# Patient Record
Sex: Male | Born: 1935 | Race: White | Hispanic: No | Smoking: Former smoker
Health system: Southern US, Community
[De-identification: ages and names within clinical notes are randomized; demographics above are authoritative.]

## PROBLEM LIST (undated history)

## (undated) DIAGNOSIS — J449 Chronic obstructive pulmonary disease, unspecified: Secondary | ICD-10-CM

## (undated) DIAGNOSIS — N400 Enlarged prostate without lower urinary tract symptoms: Secondary | ICD-10-CM

## (undated) DIAGNOSIS — K219 Gastro-esophageal reflux disease without esophagitis: Secondary | ICD-10-CM

## (undated) DIAGNOSIS — E7800 Pure hypercholesterolemia, unspecified: Secondary | ICD-10-CM

## (undated) DIAGNOSIS — I10 Essential (primary) hypertension: Secondary | ICD-10-CM

## (undated) DIAGNOSIS — F5104 Psychophysiologic insomnia: Secondary | ICD-10-CM

## (undated) DIAGNOSIS — D649 Anemia, unspecified: Secondary | ICD-10-CM

## (undated) HISTORY — DX: Chronic obstructive pulmonary disease, unspecified: J44.9

## (undated) HISTORY — DX: Pure hypercholesterolemia, unspecified: E78.00

## (undated) HISTORY — PX: OTHER SURGICAL HISTORY: SHX169

## (undated) HISTORY — DX: Essential (primary) hypertension: I10

## (undated) HISTORY — PX: NOSE SURGERY: SHX723

## (undated) HISTORY — DX: Psychophysiologic insomnia: F51.04

## (undated) HISTORY — DX: Gastro-esophageal reflux disease without esophagitis: K21.9

---

## 2010-03-22 ENCOUNTER — Telehealth: Payer: Self-pay | Admitting: Gastroenterology

## 2010-03-23 ENCOUNTER — Encounter: Payer: Self-pay | Admitting: Gastroenterology

## 2010-03-23 ENCOUNTER — Ambulatory Visit: Payer: Self-pay | Admitting: Gastroenterology

## 2010-03-23 DIAGNOSIS — R197 Diarrhea, unspecified: Secondary | ICD-10-CM | POA: Insufficient documentation

## 2010-03-23 DIAGNOSIS — R109 Unspecified abdominal pain: Secondary | ICD-10-CM | POA: Insufficient documentation

## 2010-03-31 ENCOUNTER — Ambulatory Visit (HOSPITAL_COMMUNITY)
Admission: RE | Admit: 2010-03-31 | Discharge: 2010-03-31 | Payer: Self-pay | Source: Home / Self Care | Attending: Gastroenterology | Admitting: Gastroenterology

## 2010-03-31 HISTORY — PX: COLONOSCOPY: SHX174

## 2010-05-03 ENCOUNTER — Ambulatory Visit
Admission: RE | Admit: 2010-05-03 | Discharge: 2010-05-03 | Payer: Self-pay | Source: Home / Self Care | Attending: Gastroenterology | Admitting: Gastroenterology

## 2010-05-03 DIAGNOSIS — M359 Systemic involvement of connective tissue, unspecified: Secondary | ICD-10-CM | POA: Insufficient documentation

## 2010-05-04 ENCOUNTER — Encounter: Payer: Self-pay | Admitting: Gastroenterology

## 2010-05-05 LAB — CONVERTED CEMR LAB
ALT: 10 units/L (ref 0–53)
Basophils Absolute: 0 10*3/uL (ref 0.0–0.1)
CO2: 31 meq/L (ref 19–32)
Calcium: 9.7 mg/dL (ref 8.4–10.5)
Chloride: 97 meq/L (ref 96–112)
Eosinophils Relative: 1 % (ref 0–5)
Glucose, Bld: 83 mg/dL (ref 70–99)
HCT: 43.9 % (ref 39.0–52.0)
Hemoglobin: 14.9 g/dL (ref 13.0–17.0)
Lymphocytes Relative: 15 % (ref 12–46)
Lymphs Abs: 1.2 10*3/uL (ref 0.7–4.0)
Monocytes Absolute: 0.6 10*3/uL (ref 0.1–1.0)
Neutro Abs: 6.3 10*3/uL (ref 1.7–7.7)
Platelets: 190 10*3/uL (ref 150–400)
Sodium: 137 meq/L (ref 135–145)
Total Bilirubin: 1.2 mg/dL (ref 0.3–1.2)
Total Protein: 6.7 g/dL (ref 6.0–8.3)
WBC: 8.1 10*3/uL (ref 4.0–10.5)

## 2010-05-18 NOTE — Letter (Signed)
Summary: RECORDS FROM [facility]IENT [facility]  RECORDS FROM [facility]   Imported By: Rexene Alberts 03/23/2010 14:55:22  _____________________________________________________________________  External Attachment:    Type:   Image     Comment:   External Document

## 2010-05-20 NOTE — Assessment & Plan Note (Signed)
Summary: HAVING BM EVERY HR FOR THE PAST 2WKS/LAW   Visit Type:  Initial Consult Referring Jerica Creegan:  [facility] Lior Hoen:  Heron Nay, M.D.  CC:  diarrhea.  History of Present Illness: John Chambers is a pleasant 77 year old Caucasian male who presents today at the request of Dr. Heron Nay secondary to acute diarrhea. States for past 15 days has experienced diarrhea, +abdominal cramping lower abdomen, varies in amount daily. Has gone 3X this morning, watery, loose. Not r/t eating or drinking. No brbpr. Prior to this had BM daily. Reports grandkids had "stomach bug". No use of abx recently, not hospitalized, drinks well water. Urine clear. Does report lack of appetite, daughter states wife passed away one year ago. feels might be related. pt reports weight normally around 158. today 146.   Current Medications (verified): 1)  Lorazepam 0.5 Mg Tabs (Lorazepam) .... Take 1 Tablet By Mouth Two Times A Day 2)  Lansoprazole 30 Mg Tbdp (Lansoprazole) .... Take 1 Tablet By Mouth Once A Day 3)  Lipitor 10 Mg Tabs (Atorvastatin Calcium) .... Take 1 Tablet By Mouth Once A Day 4)  Zolpidem Tartrate 10 Mg Tabs (Zolpidem Tartrate) .... Take 1 Tablet By Mouth At Bedtime 5)  Mirtazapine 15 Mg Tabs (Mirtazapine) .... Take 1 Tab By Mouth At Bedtime 6)  Advair Diskus 250-50 Mcg/dose Aepb (Fluticasone-Salmeterol) .... As Directed  Allergies (verified): 1)  ! Asa 2)  ! Penicillin  Past History:  Past Medical History: Hypertension Hypercholesterolemia GERD COPD Chronic Insomnia  Past Surgical History: Right knee X 3 Nose Last TCS 10 years ago?  Family History: Mother: non-contributory Father: MI, deceased Siblings: brother died at 66 MVA No FH of Colon Cancer:  Social History: Retired Patient currently smokes: 7 cigarettes/day Daily Caffeine Use: coffee daily Alcohol Use - yes: moderate Smoking Status:  current  Review of Systems General:  Denies fever, chills, and  anorexia. Eyes:  Denies blurring, irritation, and discharge. ENT:  Denies sore throat, hoarseness, and difficulty swallowing. CV:  Denies chest pains and syncope. Resp:  Denies dyspnea at rest and wheezing. GI:  Complains of abdominal pain and diarrhea; denies difficulty swallowing, pain on swallowing, nausea, bloody BM's, and black BMs. GU:  Denies urinary burning and urinary frequency. MS:  Denies joint pain / LOM, joint stiffness, and joint deformity. Derm:  Denies rash, itching, and dry skin. Neuro:  Denies weakness and syncope. Psych:  Denies depression and anxiety. Endo:  Denies cold intolerance and heat intolerance. Heme:  Denies bruising and bleeding.  Vital Signs:  Patient profile:   76 year old male Height:      70 inches Weight:      146.50 pounds BMI:     21.10 Temp:     99.1 degrees F oral Pulse rate:   84 / minute BP sitting:   120 / 80  (left arm) Cuff size:   regular  Vitals Entered By: Cloria Spring LPN (March 23, 2010 9:26 AM)  Physical Exam  General:  Well developed, well nourished, no acute distress. Mouth:  mucosa moist, pink.  Lungs:  Clear throughout to auscultation. Heart:  Regular rate and rhythm; no murmurs, rubs,  or bruits. Abdomen:  normal bowel sounds, without guarding, without rebound, no distesion, no tenderness, no masses, and no hepatomegally or splenomegaly.   Msk:  Symmetrical with no gross deformities. Normal posture. Pulses:  Normal pulses noted. Extremities:  No clubbing, cyanosis, edema or deformities noted. Neurologic:  Alert and  oriented x4;  grossly normal neurologically.  Skin:  Intact without significant lesions or rashes. Psych:  Alert and cooperative. Normal mood and affect.  Impression & Recommendations:  Problem # 1:  DIARRHEA (ICD-64.75) 77 year old pleasant Caucasian male with acute onset of diarrhea approximately 2 weeks ago, preceeded by abdominal cramping, not r/t eating or drinking. Denies any melena or  hematochezia. +loss of appetite, question if r/t wife's passing a year ago, weight loss of approximately 10-12 lbs reported. Last TCS approximately 10 years ago, not here. Grandkids sick with GI issues recently. Will check stool studies, immodium as needed, set up for TCS with Dr. Darrick Penna.   Stool studies (see orders) Immodium as needed TCS with Dr. Darrick Penna on 12/14 with half-lytely prep. The risks, benefits, and alternatives were discussed in detail with pt and daughter. Verbal consent obtained.  Orders: T-Culture, Stool ([id number]) T-Stool for O&P [phone]) T-Fecal WBC [id number]) T-Culture, C-Diff Toxin A/B ([id number]) T-Culture, C-Diff Toxin A/B ([id number]) T-Culture, C-Diff Toxin A/B ([id number]) Consultation Level III (16606)  Problem # 2:  ABDOMINAL PAIN (ICD-789.00) Likely r/t acute onset of diarrhea., See # 1.    Orders: T-Culture, Stool ([id number]) T-Stool for O&P ([id number]) T-Fecal WBC [phone]) T-Culture, C-Diff Toxin A/B ([id number]) T-Culture, C-Diff Toxin A/B ([id number]) T-Culture, C-Diff Toxin A/B ([id number]) Consultation Level III (85462) Prescriptions: IMODIUM A-D 2 MG TABS (LOPERAMIDE HCL) 1 by mouth every 6 hours as needed for diarrhea. do not exceed 8 tablets per day.  #120 x 0   Entered and Authorized by:   Gerrit Halls NP   Signed by:   Gerrit Halls NP on 03/23/2010   Method used:   Printed then faxed to ...       [facility] [phone]* (retail)       [address]       [place], [place]  [address]       Ph: [phone]       Fax:    RxID:   [id number]   Appended Document: HAVING BM EVERY HR FOR THE PAST 2WKS/LAW Please set up for TCS with Dr. Darrick Penna on 12/14 with half-lytely prep. She is aware.  Appended Document: HAVING BM EVERY HR FOR THE PAST 2WKS/LAW I called the number listed for the pt,lmom.

## 2010-05-20 NOTE — Progress Notes (Signed)
Summary: DIARRHEA  03/22/10: Please schedule pt to be seen ASAP for diarrhea. Pt having a BM every hour for the past 2 weeks. Call his granddaughter 03/[birth year]89 Romeo Apple at [phone] with an appt. Need him seen Mon, Tue, or Wed. His PCP is Dr. Treasa School, [phone], [place], [place]. PLEASE OBTAIN ALL RECORDS FROM SEP TO THE PRESENT. West Bali MD  March 22, 2010 11:30 AM   Appended Document: DIARRHEA PP FU IN 05/03/10 @ 1000 w/AS

## 2010-05-20 NOTE — Assessment & Plan Note (Signed)
Summary: pp fu in one months with AS/diarrhea/ss   Visit Type:  Follow-up Visit Primary Care Provider:  Heron Nay, M.D.  CC:  F/U diarrhea.  History of Present Illness: Pt is here in follow-up. Was seen for chronic diarrhea, underwent colonoscopy showing small internal hemorrhoids Dec 2011, no polyps. path +miscroscopic colitis. Started on pepto bismol tablets, 4 2x per day. Doing well no with 1-2BM daily. No melena, no brbpr. No abdominal pain, n/v. Denies dysphagia/odynophagia. Does report decreased appetite since wife passed away approximately one year ago. Does well when eating out with others, but does not have much appetite at home by himself. Down to 32, was 146 early December.    Current Medications (verified): 1)  Lorazepam 0.5 Mg Tabs (Lorazepam) .... Take 1 Tablet By Mouth Two Times A Day 2)  Lipitor 10 Mg Tabs (Atorvastatin Calcium) .... Take 1 Tablet By Mouth Once A Day 3)  Zolpidem Tartrate 10 Mg Tabs (Zolpidem Tartrate) .... Take 1 Tablet By Mouth At Bedtime 4)  Advair Diskus 250-50 Mcg/dose Aepb (Fluticasone-Salmeterol) .... As Directed 5)  Imodium A-D 2 Mg Tabs (Loperamide Hcl) .Marland Kitchen.. 1 By Mouth Every 6 Hours As Needed For Diarrhea. Do Not Exceed 8 Tablets Per Day. 6)  Pepto Bismol Tablets .... Chew 4 Tablets Twice Daily 7)  Zolpidem Tartrate 10 Mg Tabs (Zolpidem Tartrate) .... One Tablet At Bedtime 8)  Nexium 40 Mg Cpdr (Esomeprazole Magnesium) .... Take 1 Tablet By Mouth Once A Day 9)  Lisinopril-Hydrochlorothiazide 10-12.5 Mg Tabs (Lisinopril-Hydrochlorothiazide) .... Take 1 Tablet By Mouth Once A Day  Allergies (verified): 1)  ! Asa 2)  ! Penicillin  Past History:  Past Medical History: Hypertension Hypercholesterolemia GERD COPD Chronic Insomnia 03/31/2010 with [facility], TCS: FINDINGS: path showed microscopic colitis 1. Normal terminal ileum approximately 10 cm visualized. 2. Normal colon without evidence of polyps, masses, inflammatory     changes,  diverticula, or AVMs.  Random biopsy is obtained via cold     forceps for microscopic colitis. 3. Small internal hemorrhoids.  Otherwise, normal retroflexed view of     the rectum.  Review of Systems General:  Denies fever, chills, and anorexia. Eyes:  Denies blurring, irritation, and discharge. ENT:  Denies sore throat, hoarseness, and difficulty swallowing. CV:  Denies chest pains. Resp:  Denies dyspnea at rest and wheezing. GI:  Denies difficulty swallowing, pain on swallowing, nausea, abdominal pain, diarrhea, constipation, change in bowel habits, bloody BM's, and black BMs. GU:  Denies urinary burning and urinary frequency. MS:  Denies joint pain / LOM, joint swelling, and joint stiffness. Derm:  Denies rash, itching, and dry skin. Neuro:  Denies weakness and syncope. Psych:  Denies depression and anxiety. Endo:  Denies cold intolerance and heat intolerance. Heme:  Denies bruising and bleeding.  Vital Signs:  Patient profile:   77 year old male Height:      70 inches Weight:      142 pounds BMI:     20.45 Temp:     97.8 degrees F oral Pulse rate:   80 / minute BP sitting:   126 / 80  (left arm) Cuff size:   regular  Vitals Entered By: Cloria Spring LPN (May 03, 2010 10:01 AM)  Physical Exam  General:  Well developed, well nourished, no acute distress. Head:  Normocephalic and atraumatic. Eyes:  sclera without icterus, conjuctiva clear Mouth:  No deformity or lesions, dentition normal. Lungs:  Clear throughout to auscultation. Heart:  Regular rate and rhythm; no murmurs, rubs,  or bruits. Abdomen:  +BS, soft, NT, ND, no rebound or guarding, no HSM.  Msk:  Symmetrical with no gross deformities. Normal posture. Neurologic:  Alert and  oriented x4;  grossly normal neurologically. Skin:  Intact without significant lesions or rashes. Psych:  Alert and cooperative. Normal mood and affect.  Impression & Recommendations:  Problem # 1:  COLLAGENOUS COLITIS  (ICD-91.83)  77 year old male with microscopic colitis found on path from colonoscopy Dec 2011. On pepto bismol 4 tabs two times a day, diarrhea now resolved. No abdominal pain. Normal BM 1-2X per day. No melena or brbpr. Doing well. Down another 4 lbs from last visit. Does report lack of appetite when eating by himself, yet does well when eating with others. Wife passed away approximately 1 year ago, states hard to eat alone.   Continue course of pepto until complete as directed CBC/CMP today Return in 3 mos for wt check.   Orders: Est. Patient Level II (04540)  Other Orders: T-CBC w/Diff ([id number]) T-Comprehensive Metabolic Panel ([id number])  Appended Document: pp fu in one months with AS/diarrhea/ss 3 mon f/u opv is in the computer

## 2010-06-21 ENCOUNTER — Encounter: Payer: Self-pay | Admitting: Gastroenterology

## 2010-06-28 ENCOUNTER — Ambulatory Visit: Payer: Self-pay | Admitting: Gastroenterology

## 2010-07-22 ENCOUNTER — Encounter: Payer: Self-pay | Admitting: Gastroenterology

## 2010-12-05 ENCOUNTER — Observation Stay (HOSPITAL_COMMUNITY)
Admission: EM | Admit: 2010-12-05 | Discharge: 2010-12-06 | Disposition: A | Discharge status: Home / Self Care | Payer: Medicare Other | Attending: Internal Medicine | Admitting: Internal Medicine

## 2010-12-05 ENCOUNTER — Encounter (HOSPITAL_COMMUNITY): Payer: Self-pay | Admitting: Emergency Medicine

## 2010-12-05 ENCOUNTER — Emergency Department (HOSPITAL_COMMUNITY): Payer: Medicare Other

## 2010-12-05 ENCOUNTER — Other Ambulatory Visit: Payer: Self-pay

## 2010-12-05 DIAGNOSIS — I10 Essential (primary) hypertension: Secondary | ICD-10-CM | POA: Diagnosis present

## 2010-12-05 DIAGNOSIS — K219 Gastro-esophageal reflux disease without esophagitis: Secondary | ICD-10-CM | POA: Insufficient documentation

## 2010-12-05 DIAGNOSIS — R0789 Other chest pain: Principal | ICD-10-CM | POA: Insufficient documentation

## 2010-12-05 DIAGNOSIS — Z91199 Patient's noncompliance with other medical treatment and regimen due to unspecified reason: Secondary | ICD-10-CM | POA: Insufficient documentation

## 2010-12-05 DIAGNOSIS — E785 Hyperlipidemia, unspecified: Secondary | ICD-10-CM | POA: Insufficient documentation

## 2010-12-05 DIAGNOSIS — E871 Hypo-osmolality and hyponatremia: Secondary | ICD-10-CM | POA: Insufficient documentation

## 2010-12-05 DIAGNOSIS — J449 Chronic obstructive pulmonary disease, unspecified: Secondary | ICD-10-CM | POA: Insufficient documentation

## 2010-12-05 DIAGNOSIS — R079 Chest pain, unspecified: Secondary | ICD-10-CM | POA: Diagnosis present

## 2010-12-05 DIAGNOSIS — Z9119 Patient's noncompliance with other medical treatment and regimen: Secondary | ICD-10-CM | POA: Insufficient documentation

## 2010-12-05 DIAGNOSIS — J4489 Other specified chronic obstructive pulmonary disease: Secondary | ICD-10-CM | POA: Insufficient documentation

## 2010-12-05 DIAGNOSIS — Z79899 Other long term (current) drug therapy: Secondary | ICD-10-CM | POA: Insufficient documentation

## 2010-12-05 LAB — COMPREHENSIVE METABOLIC PANEL
ALT: 14 U/L (ref 0–53)
AST: 23 U/L (ref 0–37)
Alkaline Phosphatase: 64 U/L (ref 39–117)
CO2: 30 mEq/L (ref 19–32)
Calcium: 9.6 mg/dL (ref 8.4–10.5)
Chloride: 95 mEq/L — ABNORMAL LOW (ref 96–112)
GFR calc Af Amer: >60 mL/min (ref >60)
GFR calc non Af Amer: >60 mL/min (ref >60)
Glucose, Bld: 102 mg/dL — ABNORMAL HIGH (ref 70–99)
Potassium: 3.7 mEq/L (ref 3.5–5.1)
Sodium: 133 mEq/L — ABNORMAL LOW (ref 135–145)
Total Bilirubin: 0.3 mg/dL (ref 0.3–1.2)

## 2010-12-05 LAB — CBC
MCV: 93.8 fL (ref 78.0–100.0)
Platelets: 165 10*3/uL (ref 150–400)
RBC: 3.89 MIL/uL — ABNORMAL LOW (ref 4.22–5.81)
RDW: 12.7 % (ref 11.5–15.5)
WBC: 6.5 10*3/uL (ref 4.0–10.5)

## 2010-12-05 LAB — DIFFERENTIAL
Basophils Absolute: 0 10*3/uL (ref 0.0–0.1)
Lymphocytes Relative: 21 % (ref 12–46)
Lymphs Abs: 1.4 10*3/uL (ref 0.7–4.0)
Neutro Abs: 4.3 10*3/uL (ref 1.7–7.7)
Neutrophils Relative %: 66 % (ref 43–77)

## 2010-12-05 LAB — POCT I-STAT TROPONIN I

## 2010-12-05 LAB — CARDIAC PANEL(CRET KIN+CKTOT+MB+TROPI): Troponin I: <0.3 ng/mL (ref <0.30)

## 2010-12-05 MED ORDER — LISINOPRIL 10 MG PO TABS
10.0000 mg | ORAL_TABLET | Freq: Every day | ORAL | Status: DC
  Administered 2010-12-06: 10 mg via ORAL
  Filled 2010-12-05: qty 1

## 2010-12-05 MED ORDER — SIMVASTATIN 20 MG PO TABS
20.0000 mg | ORAL_TABLET | Freq: Every day | ORAL | Status: DC
  Administered 2010-12-06: 20 mg via ORAL
  Filled 2010-12-05: qty 1

## 2010-12-05 MED ORDER — LISINOPRIL 10 MG PO TABS: 10.0000 mg | ORAL_TABLET | Freq: Every day | ORAL | Status: DC

## 2010-12-05 MED ORDER — LORAZEPAM 0.5 MG PO TABS
0.5000 mg | ORAL_TABLET | Freq: Three times a day (TID) | ORAL | Status: DC
  Administered 2010-12-05 – 2010-12-06 (×2): 0.5 mg via ORAL
  Filled 2010-12-05 (×3): qty 1

## 2010-12-05 MED ORDER — LISINOPRIL-HYDROCHLOROTHIAZIDE 10-12.5 MG PO TABS: 1.0000 | ORAL_TABLET | Freq: Every day | ORAL | Status: DC

## 2010-12-05 MED ORDER — SODIUM CHLORIDE 0.9 % IV SOLN
Freq: Once | INTRAVENOUS | Status: AC
  Administered 2010-12-05: 19:00:00 via INTRAVENOUS

## 2010-12-05 MED ORDER — LISINOPRIL 2.5 MG PO TABS: 12.5000 mg | ORAL_TABLET | Freq: Once | ORAL | Status: DC

## 2010-12-05 MED ORDER — HYDROCHLOROTHIAZIDE 25 MG PO TABS
12.5000 mg | ORAL_TABLET | Freq: Every day | ORAL | Status: DC
  Administered 2010-12-06: 12.5 mg via ORAL
  Filled 2010-12-05: qty 1

## 2010-12-05 MED ORDER — SODIUM CHLORIDE 0.9 % IJ SOLN
3.0000 mL | Freq: Two times a day (BID) | INTRAMUSCULAR | Status: DC
  Administered 2010-12-06: 3 mL via INTRAVENOUS
  Filled 2010-12-05: qty 3

## 2010-12-05 MED ORDER — SODIUM CHLORIDE 0.9 % IV SOLN: 250.0000 mL | INTRAVENOUS | Status: DC

## 2010-12-05 MED ORDER — SODIUM CHLORIDE 0.9 % IJ SOLN: 3.0000 mL | INTRAMUSCULAR | Status: DC | PRN

## 2010-12-05 MED ORDER — PANTOPRAZOLE SODIUM 40 MG PO TBEC
40.0000 mg | DELAYED_RELEASE_TABLET | Freq: Every day | ORAL | Status: DC
  Administered 2010-12-06: 40 mg via ORAL
  Filled 2010-12-05: qty 1

## 2010-12-05 MED ORDER — FLUTICASONE-SALMETEROL 250-50 MCG/DOSE IN AEPB
1.0000 | INHALATION_SPRAY | Freq: Two times a day (BID) | RESPIRATORY_TRACT | Status: DC
  Administered 2010-12-06: 1 via RESPIRATORY_TRACT
  Filled 2010-12-05: qty 14

## 2010-12-05 MED ORDER — MORPHINE SULFATE 2 MG/ML IJ SOLN: 1.0000 mg | INTRAMUSCULAR | Status: DC | PRN

## 2010-12-05 MED ORDER — HYDRALAZINE HCL 20 MG/ML IJ SOLN
10.0000 mg | Freq: Four times a day (QID) | INTRAMUSCULAR | Status: DC | PRN
  Filled 2010-12-05: qty 1

## 2010-12-05 NOTE — Progress Notes (Signed)
  [id number] [id number]

## 2010-12-05 NOTE — ED Notes (Signed)
Patient states that he has no pain.  Pt says he still feels somewhat SOB.  Respirations 20 per minute.

## 2010-12-05 NOTE — ED Notes (Signed)
Patient states that he began to have chest pain radiating to the left arm starting 3 hours ago after walking back to his house from feeding his dog.  He also began to have SOB.  Pt states that he took mylanta prior to arrival.  Past medical Hx of HTN, high cholesterol and current smoker.  Has a family hx of MI.

## 2010-12-05 NOTE — ED Provider Notes (Signed)
History  Scribed for Benny Lennert, MD, the patient was seen in room 10. This chart was scribed by Jannette Fogo. This patient's care was started at 1737.   CSN: [id number] Arrival date & time: 12/05/2010  5:21 PM  Chief Complaint  Patient presents with  . Chest Pain   HPI John Chambers is a 77 y.o. male with a history of COPD, HTN, and GERD who presents to the Emergency Department complaining of chest pain starting today which is currently improved. Patient reports that the chest pain began after coming back home from walking his dog. Chest pain radiated to the left arm and he had associated mild diaphoresis and SOB but denies nausea. Patient reports that chest pain was so severe that he could not pick up the phone. Chest pain lasted for "some time" and then improved.  Granddaughter states she found the patient diaphoretic, flushed, and breathing hard. The patient denies having any heart problems, cardiac catheterization, or stress test in the past. He reports an episode of chest pain two days ago that he attributes to possible heartburn/reflux which improved after Tums/Rolaids per family.  The patient reports that he quick tobacco use. There are no other associated symptoms and no other alleviating or aggravating factors.   PCP: Dr. Jeanie Sewer No present cardiologist   HPI ELEMENTS:  Location: chest Onset: ~3 hours ago Duration: improved Severity: 4/10 Context:  as above  Associated symptoms: mild diaphoresis, and SOB but denies nausea   Past Medical History  Diagnosis Date  . Hypertension   . Hypercholesterolemia   . GERD (gastroesophageal reflux disease)   . COPD (chronic obstructive pulmonary disease)   . Chronic insomnia     Past Surgical History  Procedure Date  . Colonoscopy 03/31/10    Dr. Darrick Penna  . Right knee     x3  . Nose surgery    MEDICATIONS:  Previous Medications   ATORVASTATIN (LIPITOR) 10 MG TABLET    Take 10 mg by mouth daily.     ESOMEPRAZOLE (NEXIUM) 40  MG CAPSULE    Take 40 mg by mouth daily before breakfast.     FLUTICASONE-SALMETEROL (ADVAIR DISKUS) 250-50 MCG/DOSE AEPB    Inhale 1 puff into the lungs every 12 (twelve) hours. AS DIRECTED    LISINOPRIL-HYDROCHLOROTHIAZIDE (PRINZIDE,ZESTORETIC) 10-12.5 MG PER TABLET    Take 1 tablet by mouth daily.     LOPERAMIDE (IMODIUM A-D) 2 MG TABLET    Take 2 mg by mouth 4 (four) times daily as needed.     LORAZEPAM (ATIVAN) 0.5 MG TABLET    Take 0.5 mg by mouth every 8 (eight) hours.     ZOLPIDEM (AMBIEN) 10 MG TABLET    Take 10 mg by mouth at bedtime as needed.       ALLERGIES:  Allergies as of 12/05/2010 - Review Complete 12/05/2010  Allergen Reaction Noted  . Aspirin    . Penicillins      FAMILY HISTORY:  + AMI  SOCIAL HISTORY: Accompanied to the ED by grandaughter History  Substance Use Topics  . Smoking status: Current Everyday Smoker -- 55 years    Types: Cigarettes  . Smokeless tobacco: Not on file  . Alcohol Use:     Review of Systems  Constitutional: Positive for diaphoresis. Negative for fatigue.  HENT: Negative for congestion, sinus pressure and ear discharge.   Eyes: Negative for discharge.  Respiratory: Positive for shortness of breath. Negative for cough.   Cardiovascular: Positive for chest pain.  Gastrointestinal:  Negative for nausea, vomiting, abdominal pain and diarrhea.  Genitourinary: Negative for frequency and hematuria.  Musculoskeletal: Negative for back pain.  Skin: Negative for rash.  Neurological: Negative for seizures and headaches.  Hematological: Negative.   Psychiatric/Behavioral: Negative for hallucinations.  All other systems reviewed and are negative.    Physical Exam  BP 187/106  Pulse 83  Temp(Src) 98.4 F (36.9 C) (Oral)  Resp 20  SpO2 95%  Physical Exam  Constitutional: He is oriented to person, place, and time. He appears well-developed. No distress.       Patient resting and talking comfortably  HENT:  Head: Normocephalic and  atraumatic.  Mouth/Throat: Oropharynx is clear and moist.  Eyes: Conjunctivae and EOM are normal. No scleral icterus.  Neck: Normal range of motion. Neck supple. No thyromegaly present.  Cardiovascular: Normal rate, regular rhythm, normal heart sounds and intact distal pulses.  Exam reveals no gallop and no friction rub.   No murmur heard. Pulmonary/Chest: Effort normal and breath sounds normal. No stridor. He has no wheezes. He has no rales. He exhibits no tenderness.  Abdominal: Soft. Bowel sounds are normal. He exhibits no distension. There is no tenderness. There is no rebound.  Musculoskeletal: Normal range of motion. He exhibits no edema.  Lymphadenopathy:    He has no cervical adenopathy.  Neurological: He is alert and oriented to person, place, and time. He displays normal reflexes. Coordination normal.  Skin: Skin is warm and dry. No rash noted. No erythema.  Psychiatric: He has a normal mood and affect. His behavior is normal.    OTHER DATA REVIEWED: Nursing notes, vital signs, and past medical records reviewed.   DIAGNOSTIC STUDIES: Oxygen Saturation is 95% on room air, normal, by my interpretation.    EKG  Date: 12/05/2010  Rate: 92  Rhythm: normal sinus rhythm  QRS Axis: normal  Intervals: normal  ST/T Wave abnormalities: normal  Conduction Disutrbances:none  Narrative Interpretation:   Old EKG Reviewed: none available   Radiology: CXR: 2 View; Interpreted by Radiologist and reviewed by me: IMPRESSION: Chronic obstructive pulmonary disease. No acute cardiopulmonary process identified. Original Report Authenticated By: Gerrianne Scale, M.D.   LABS: Results for orders placed during the hospital encounter of 12/05/10  CBC      Component Value Range   WBC 6.5  4.0 - 10.5 (K/uL)   RBC 3.89 (*) 4.22 - 5.81 (MIL/uL)   Hemoglobin 12.9 (*) 13.0 - 17.0 (g/dL)   HCT 16.1 (*) 09.6 - 52.0 (%)   MCV 93.8  78.0 - 100.0 (fL)   MCH 33.2  26.0 - 34.0 (pg)   MCHC 35.3   30.0 - 36.0 (g/dL)   RDW 04.5  40.9 - 81.1 (%)   Platelets 165  150 - 400 (K/uL)  DIFFERENTIAL      Component Value Range   Neutrophils Relative 66  43 - 77 (%)   Neutro Abs 4.3  1.7 - 7.7 (K/uL)   Lymphocytes Relative 21  12 - 46 (%)   Lymphs Abs 1.4  0.7 - 4.0 (K/uL)   Monocytes Relative 11  3 - 12 (%)   Monocytes Absolute 0.7  0.1 - 1.0 (K/uL)   Eosinophils Relative 2  0 - 5 (%)   Eosinophils Absolute 0.1  0.0 - 0.7 (K/uL)   Basophils Relative 0  0 - 1 (%)   Basophils Absolute 0.0  0.0 - 0.1 (K/uL)  COMPREHENSIVE METABOLIC PANEL      Component Value Range   Sodium  133 (*) 135 - 145 (mEq/L)   Potassium 3.7  3.5 - 5.1 (mEq/L)   Chloride 95 (*) 96 - 112 (mEq/L)   CO2 30  19 - 32 (mEq/L)   Glucose, Bld 102 (*) 70 - 99 (mg/dL)   BUN 9  6 - 23 (mg/dL)   Creatinine, Ser 1.61  0.50 - 1.35 (mg/dL)   Calcium 9.6  8.4 - 09.6 (mg/dL)   Total Protein 7.0  6.0 - 8.3 (g/dL)   Albumin 4.1  3.5 - 5.2 (g/dL)   AST 23  0 - 37 (U/L)   ALT 14  0 - 53 (U/L)   Alkaline Phosphatase 64  39 - 117 (U/L)   Total Bilirubin 0.3  0.3 - 1.2 (mg/dL)   GFR calc non Af Amer >60  >60 (mL/min)   GFR calc Af Amer >60  >60 (mL/min)  CARDIAC PANEL(CRET KIN+CKTOT+MB+TROPI)      Component Value Range   Total CK 118  7 - 232 (U/L)   CK, MB 3.1  0.3 - 4.0 (ng/mL)   Troponin I <0.30  <0.30 (ng/mL)   Relative Index 2.6 (*) 0.0 - 2.5   POCT I-STAT TROPONIN I      Component Value Range   Troponin i, poc 0.00  0.00 - 0.08 (ng/mL)   Comment 3             ED COURSE / COORDINATION OF CARE: 1740 - Patient evaluated by ED physician, labs, CXR, Stat troponin, and IV fluids ordered 1956- Recheck, Patient is asymptomatic and denies any chest pain at this time. BP elevated at 187/106. P 79 O2 100% on RA, ED physician discussed results with the patient and family as well as recommendation for admission and further evaluation/treatment. 20:20 - The case was discussed with Hospitalist, Dr. Onalee Hua regarding admission   MDM:  chest pain,  Possible cardiac,  noncardiac  IMPRESSION: Chest pain, unspecified    PLAN:  Admitted to Telemetry the case was discussed with the admitting physician Dr. Onalee Hua   CONDITION ON Admission: Stable  MEDICATIONS GIVEN IN THE E.D.  Medications  0.9 %  sodium chloride infusion (  Intravenous New Bag 12/05/10 1830)     Procedures  The chart was scribed for me under my direct supervision.  I personally performed the history, physical, and medical decision making and all procedures in the evaluation of this patient.Benny Lennert, MD 12/05/10 2029

## 2010-12-05 NOTE — ED Notes (Signed)
Family at bedside. No one needs anything at this time. 

## 2010-12-06 DIAGNOSIS — I10 Essential (primary) hypertension: Secondary | ICD-10-CM | POA: Diagnosis present

## 2010-12-06 DIAGNOSIS — J449 Chronic obstructive pulmonary disease, unspecified: Secondary | ICD-10-CM | POA: Diagnosis present

## 2010-12-06 DIAGNOSIS — K219 Gastro-esophageal reflux disease without esophagitis: Secondary | ICD-10-CM | POA: Diagnosis present

## 2010-12-06 DIAGNOSIS — R079 Chest pain, unspecified: Secondary | ICD-10-CM | POA: Diagnosis present

## 2010-12-06 DIAGNOSIS — E785 Hyperlipidemia, unspecified: Secondary | ICD-10-CM | POA: Diagnosis present

## 2010-12-06 LAB — BASIC METABOLIC PANEL
CO2: 30 mEq/L (ref 19–32)
Calcium: 9.4 mg/dL (ref 8.4–10.5)
GFR calc Af Amer: >60 mL/min (ref >60)
GFR calc non Af Amer: >60 mL/min (ref >60)
Sodium: 136 mEq/L (ref 135–145)

## 2010-12-06 LAB — CARDIAC PANEL(CRET KIN+CKTOT+MB+TROPI)
CK, MB: 3 ng/mL (ref 0.3–4.0)
Relative Index: INVALID (ref 0.0–2.5)
Relative Index: INVALID (ref 0.0–2.5)
Total CK: 88 U/L (ref 7–232)
Total CK: 75 U/L (ref 7–232)
Troponin I: <0.3 ng/mL (ref <0.30)
Troponin I: <0.3 ng/mL (ref <0.30)

## 2010-12-06 LAB — CBC
MCV: 95.4 fL (ref 78.0–100.0)
Platelets: 162 10*3/uL (ref 150–400)
RBC: 3.93 MIL/uL — ABNORMAL LOW (ref 4.22–5.81)
WBC: 6.4 10*3/uL (ref 4.0–10.5)

## 2010-12-06 NOTE — Progress Notes (Signed)
Pt discharge instructions given. Pt and granddaughter verbalized understanding. Pt accompanied to awaiting vehicle via wheelchair. No pain at discharge. Pt stable at discharge. Arlyss Queen, RN

## 2010-12-06 NOTE — Discharge Summary (Signed)
Physician Discharge Summary  John Chambers MRN: [id number] DOB/AGE: 08/15/1926 77 y.o.  PCP: Dr. Christiane Ha [facility], [place])   Admit date: 12/05/2010 Discharge date: 12/06/2010  Discharge Diagnoses:  1. Atypical chest pain. 2. Hypertension. 3. Gastroesophageal reflux disease, noncompliant with Nexium. 4. COPD, remains stable. 5. Hyperlipidemia. 6. Mild hyponatremia, resolved.    Current Discharge Medication List    CONTINUE these medications which have NOT CHANGED   Details  acetaminophen (TYLENOL) 500 MG tablet Take 500 mg by mouth every 6 (six) hours as needed. Pain     atorvastatin (LIPITOR) 10 MG tablet Take 10 mg by mouth daily.      calcium carbonate (OS-CAL) 600 MG TABS Take 600 mg by mouth daily.      esomeprazole (NEXIUM) 40 MG capsule Take 40 mg by mouth daily before breakfast.      Fluticasone-Salmeterol (ADVAIR DISKUS) 250-50 MCG/DOSE AEPB Inhale 1 puff into the lungs every 12 (twelve) hours. AS DIRECTED     hydroxypropyl methylcellulose (ISOPTO TEARS) 2.5 % ophthalmic solution Place 1 drop into both eyes daily as needed. Dry Eyes     lisinopril-hydrochlorothiazide (PRINZIDE,ZESTORETIC) 10-12.5 MG per tablet Take 1 tablet by mouth daily.      loperamide (IMODIUM A-D) 2 MG tablet Take 2 mg by mouth 4 (four) times daily as needed.      LORazepam (ATIVAN) 0.5 MG tablet Take 0.5 mg by mouth 2 (two) times daily. Anxiety     QUEtiapine (SEROQUEL) 25 MG tablet Take 25 mg by mouth at bedtime.      Tamsulosin HCl (FLOMAX) 0.4 MG CAPS Take 0.4 mg by mouth at bedtime.        STOP taking these medications     zolpidem (AMBIEN) 10 MG tablet         Discharge Condition: Stable and improved.  Disposition: Final discharge disposition not confirmed   Consults: None   Significant Diagnostic Studies: Dg Chest 2 View  12/05/2010  *RADIOLOGY REPORT*  Clinical Data: Shortness of breath.  Chest pain.  CHEST - 2 VIEW  Comparison: None.  Findings: The heart size and  mediastinal contours are normal. There is mild aortic atherosclerosis.  The lungs are hyperinflated. There are prominent nipple shadows bilaterally as well as probable small granulomas at the right lung base.  No confluent airspace opacity or pleural effusion is present.  No acute osseous findings are seen.  IMPRESSION: Chronic obstructive pulmonary disease.  No acute cardiopulmonary process identified.  Original Report Authenticated By: Gerrianne Scale, M.D.   EKG: Normal sinus rhythm, 92 beats per minute.   Microbiology: No results found for this or any previous visit (from the past 240 hour(s)).   Labs: Results for orders placed during the hospital encounter of 12/05/10 (from the past 48 hour(s))  CBC     Status: Abnormal   Collection Time   12/05/10  6:03 PM      Component Value Range Comment   WBC 6.5  4.0 - 10.5 (K/uL)    RBC 3.89 (*) 4.22 - 5.81 (MIL/uL)    Hemoglobin 12.9 (*) 13.0 - 17.0 (g/dL)    HCT 30.8 (*) 65.7 - 52.0 (%)    MCV 93.8  78.0 - 100.0 (fL)    MCH 33.2  26.0 - 34.0 (pg)    MCHC 35.3  30.0 - 36.0 (g/dL)    RDW 84.6  96.2 - 95.2 (%)    Platelets 165  150 - 400 (K/uL)   DIFFERENTIAL  Status: Normal   Collection Time   12/05/10  6:03 PM      Component Value Range Comment   Neutrophils Relative 66  43 - 77 (%)    Neutro Abs 4.3  1.7 - 7.7 (K/uL)    Lymphocytes Relative 21  12 - 46 (%)    Lymphs Abs 1.4  0.7 - 4.0 (K/uL)    Monocytes Relative 11  3 - 12 (%)    Monocytes Absolute 0.7  0.1 - 1.0 (K/uL)    Eosinophils Relative 2  0 - 5 (%)    Eosinophils Absolute 0.1  0.0 - 0.7 (K/uL)    Basophils Relative 0  0 - 1 (%)    Basophils Absolute 0.0  0.0 - 0.1 (K/uL)   COMPREHENSIVE METABOLIC PANEL     Status: Abnormal   Collection Time   12/05/10  6:03 PM      Component Value Range Comment   Sodium 133 (*) 135 - 145 (mEq/L)    Potassium 3.7  3.5 - 5.1 (mEq/L)    Chloride 95 (*) 96 - 112 (mEq/L)    CO2 30  19 - 32 (mEq/L)    Glucose, Bld 102 (*) 70 - 99  (mg/dL)    BUN 9  6 - 23 (mg/dL)    Creatinine, Ser 1.61  0.50 - 1.35 (mg/dL)    Calcium 9.6  8.4 - 10.5 (mg/dL)    Total Protein 7.0  6.0 - 8.3 (g/dL)    Albumin 4.1  3.5 - 5.2 (g/dL)    AST 23  0 - 37 (U/L)    ALT 14  0 - 53 (U/L)    Alkaline Phosphatase 64  39 - 117 (U/L)    Total Bilirubin 0.3  0.3 - 1.2 (mg/dL)    GFR calc non Af Amer >60  >60 (mL/min)    GFR calc Af Amer >60  >60 (mL/min)   CARDIAC PANEL(CRET KIN+CKTOT+MB+TROPI)     Status: Abnormal   Collection Time   12/05/10  6:03 PM      Component Value Range Comment   Total CK 118  7 - 232 (U/L)    CK, MB 3.1  0.3 - 4.0 (ng/mL)    Troponin I <0.30  <0.30 (ng/mL)    Relative Index 2.6 (*) 0.0 - 2.5    POCT I-STAT TROPONIN I     Status: Normal   Collection Time   12/05/10  6:22 PM      Component Value Range Comment   Troponin i, poc 0.00  0.00 - 0.08 (ng/mL)    Comment 3            CARDIAC PANEL(CRET KIN+CKTOT+MB+TROPI)     Status: Normal   Collection Time   12/05/10 11:40 PM      Component Value Range Comment   Total CK 88  7 - 232 (U/L)    CK, MB 2.8  0.3 - 4.0 (ng/mL)    Troponin I <0.30  <0.30 (ng/mL)    Relative Index RELATIVE INDEX IS INVALID  0.0 - 2.5    BASIC METABOLIC PANEL     Status: Normal   Collection Time   12/06/10  4:39 AM      Component Value Range Comment   Sodium 136  135 - 145 (mEq/L)    Potassium 3.8  3.5 - 5.1 (mEq/L)    Chloride 99  96 - 112 (mEq/L)    CO2 30  19 - 32 (mEq/L)  Glucose, Bld 94  70 - 99 (mg/dL)    BUN 7  6 - 23 (mg/dL)    Creatinine, Ser 8.11  0.50 - 1.35 (mg/dL)    Calcium 9.4  8.4 - 10.5 (mg/dL)    GFR calc non Af Amer >60  >60 (mL/min)    GFR calc Af Amer >60  >60 (mL/min)   CBC     Status: Abnormal   Collection Time   12/06/10  4:39 AM      Component Value Range Comment   WBC 6.4  4.0 - 10.5 (K/uL)    RBC 3.93 (*) 4.22 - 5.81 (MIL/uL)    Hemoglobin 13.0  13.0 - 17.0 (g/dL)    HCT 91.4 (*) 78.2 - 52.0 (%)    MCV 95.4  78.0 - 100.0 (fL)    MCH 33.1  26.0 - 34.0  (pg)    MCHC 34.7  30.0 - 36.0 (g/dL)    RDW 95.6  21.3 - 08.6 (%)    Platelets 162  150 - 400 (K/uL)   CARDIAC PANEL(CRET KIN+CKTOT+MB+TROPI)     Status: Normal   Collection Time   12/06/10  5:00 AM      Component Value Range Comment   Total CK 75  7 - 232 (U/L)    CK, MB 3.0  0.3 - 4.0 (ng/mL)    Troponin I <0.30  <0.30 (ng/mL)    Relative Index RELATIVE INDEX IS INVALID  0.0 - 2.5       HPI :  The patient is an 77 year old man with a past medical history significant for COPD, hypertension, hyperlipidemia, and gastroesophageal reflux disease. He presented to the emergency department on 12/05/2010 with a chief complaint of sharp chest pain. Apparently he took Maalox over-the-counter on 2 occasions which relieved his pain. He admitted being noncompliant with Nexium lately. In the emergency department, his EKG revealed normal sinus rhythm without any ST or T wave abnormalities. His chest x-ray revealed COPD changes. He was hypertensive with a blood pressure 187/106. His initial cardiac enzymes were negative. He was admitted for further evaluation and management.    HOSPITAL COURSE:  He was restarted on all of his chronic medications. As needed IV hydralazine was added for a systolic blood pressure greater than 180. Protonix was substituted for Nexium. He became chest pain-free prior to admission and required no pain medications during the hospitalization. For further evaluation, cardiac enzymes and a 2-D echocardiogram was ordered. The 2-D echocardiogram could not be performed today as there was no available technician. However, all of his cardiac enzymes were within normal limits. His serum sodium which had been slightly low on admission, normalized with gentle IV fluids. His blood pressure which had been initially malignant, became well controlled on lisinopril and hydrochlorothiazide, which are his chronic antihypertensive medications.   Upon questioning, the patient admitted not being  compliant with Nexium, although he was advised to take it daily for GERD. He was instructed to avoid stopping Nexium abruptly as it can cause rebound acid reflux. He voiced understanding. He was also instructed to followup with his primary care physician who can refer him back to his cardiologist. Apparently, he underwent a stress test a couple of years ago and it was reported as normal.    Discharge Exam:  Blood pressure 123/73, pulse 65, temperature 98.2 F (36.8 C), temperature source Oral, resp. rate 20, height 5\' 10"  (1.778 m), weight 68.7 kg (151 lb 7.3 oz), SpO2 95.00%. General: The patient is lying  in bed, in no acute distress. Lungs: Clear to auscultation anteriorly, decreased breath sounds in the bases. No audible wheezes or crackles. Heart: S1-S2 with no murmurs rubs or gallops. Abdomen: Positive bowel sounds, nontender, nondistended. Extremities: No pedal edema.     Discharge Orders    Future Orders Please Complete By Expires   Diet - low sodium heart healthy      Increase activity slowly      Discharge instructions      Comments:   TAKE NEXIUM DAILY WITHOUT MISSING ANY DOSES. ALSO, HAVE YOUR DOCTOR REFER YOU TO YOUR CARDIOLOGIST FOR FOLLOW UP  EVALUATION.      Follow-up Information    Please follow up. (Follow up with Dr. Christiane Ha in 1-2 weeks)          Signed: Christabell Loseke 12/06/2010, 2:34 PM

## 2010-12-06 NOTE — H&P (Signed)
NAME:  NOVAH, GOZA NO.:  [id number]  MEDICAL RECORD NO.:  [id number]  LOCATION:  A315                          FACILITY:  [facility]  PHYSICIAN:  Tarry Kos, MD       DATE OF BIRTH:  06/27/1926  DATE OF ADMISSION:  12/05/2010 DATE OF DISCHARGE:  LH                             HISTORY & PHYSICAL   CHIEF COMPLAINT:  Chest pain.  HISTORY OF PRESENT ILLNESS:  Mr. Coleman is an 77 year old male with history of COPD, hypertension, hyperlipidemia, and GERD, who presents to the emergency department after experiencing some substernal chest pain, sharp pain that lasted over 30 minutes that was relieved by some Maalox at home, after walking his dog.  He does have a history of bad heartburn, he says this felt differently, he said the pain rate kindly of radiated down his left warm, he did not experience any nausea or vomiting with it.  He denies any shortness of breath but because of persistent so long he came to the ED.  Again, he took some over-the- counter Maalox at home which relieved the pain, he is currently chest pain free.  He denies any diarrhea.  Denies any recent fevers.  Denies any coughing.  Denies any lower extremity edema.  REVIEW OF SYSTEMS:  Otherwise negative.  PAST MEDICAL HISTORY: 1. Hypertension. 2. Hyperlipidemia. 3. COPD. 4. GERD. 5. Chronic insomnia. 6. Small internal hemorrhoids.  SOCIAL HISTORY:  He is a smoker and occasional alcohol.  No IV drug abuse.  He lives alone.  He has two children.  MEDICATIONS: 1. Lipitor 10 mg a day. 2. Nexium 40 mg a day. 3. Advair Diskus 1 puff so twice a day. 4. Lisinopril/hydrochlorothiazide 10/12.5 daily. 5. Ativan 0.5 mg every 8 hours as needed. 6. Ambien 10 mg nightly as needed for insomnia.  He takes all of his medications at night and he has not taken them today yet.  ALLERGIES:  ASPIRIN causes swelling.  PENICILLIN causes swelling.  PHYSICAL EXAMINATION:  VITAL SIGNS:  Temperature 97.6, pulse  71, respirations 20, and his blood pressure is currently 191/121. GENERAL:  He is alert and oriented x4.  No apparent stress, cooperative, and friendly. HEENT:  Head is normocephalic and atraumatic.  Pupils equal and reactive to light.  Oropharynx, clear moist.  No JVD.  No carotid bruits. HEART:  Regular rate and rhythm without murmurs, rubs, or gallops. CHEST:  Clear to auscultation bilaterally.  No rales. ABDOMEN:  Soft and nontender.  Positive bowel sounds.  No hepatosplenomegaly. EXTREMITIES:  No clubbing, cyanosis, or edema. PSYCH:  Normal affect. NEUROLOGIC:  No focal neurologic deficits. SKIN:  No rashes.  LABORATORY DATA:  Sodium is 133.  Electrolytes are otherwise normal. LFTs normal.  BUN and creatinine normal.  Cardiac enzymes negative. Hemoglobin is 12.9.  White count is normal.  Differential is normal. Chest x-ray shows no acute cardiopulmonary process.  A 12-lead EKG was reported to me no acute changes.  However, I cannot find this 12-lead EKG,  we are trying to check it down now.  ASSESSMENT AND PLAN:  This is an 77 year old male with atypical chest pain; 1. Atypical chest pain, place on telemetry,  rule out serial cardiac     enzymes and obtain a 2-D echo in the morning and will likely     proceed with further outpatient cardiac stress testing.  Cannot     receive any aspirin due to allergy which sounds like a true     allergy. 2. Hypertension, currently uncontrolled.  He is not taking his     medications tonight, I am going to give his p.o. medications and if     needed later on we will provide him a p.r.n. hydralazine dose, but     we will try to give him his normal medications now. 3. Chronic obstructive pulmonary disease.  Continue his Advair. 4. Anxiety and insomnia.  Continue his Ativan and Ambien. 5. Further recommendation pending over hospital course.                                           ______________________________ Tarry Kos,  MD     RD/MEDQ  D:  12/05/2010  T:  12/06/2010  Job:  [id number]

## 2010-12-06 NOTE — Progress Notes (Signed)
Patient's blood pressure was running high in the ED and when he got to the floor, it was 191/121.  Dr. Onalee Hua was made aware.  Hydralazine was ordered prn per parameters.  I pulled the Hydralazine, but when I checked his blood pressure again before administering it, it was 155/65 and his pulse was 65 so no medication was given for blood pressure.  The patient is not complaining of any pain at all at this point.

## 2010-12-27 NOTE — Progress Notes (Signed)
Encounter addended by: Clarene Critchley on: 12/27/2010  9:07 AM<BR>     Documentation filed: Flowsheet VN

## 2011-06-27 ENCOUNTER — Emergency Department (HOSPITAL_COMMUNITY): Payer: Medicare Other

## 2011-06-27 ENCOUNTER — Emergency Department (HOSPITAL_COMMUNITY)
Admission: EM | Admit: 2011-06-27 | Discharge: 2011-06-27 | Disposition: A | Discharge status: Home / Self Care | Payer: Medicare Other | Attending: Emergency Medicine | Admitting: Emergency Medicine

## 2011-06-27 ENCOUNTER — Encounter (HOSPITAL_COMMUNITY): Payer: Self-pay | Admitting: *Deleted

## 2011-06-27 DIAGNOSIS — M549 Dorsalgia, unspecified: Secondary | ICD-10-CM | POA: Insufficient documentation

## 2011-06-27 DIAGNOSIS — R079 Chest pain, unspecified: Secondary | ICD-10-CM | POA: Insufficient documentation

## 2011-06-27 DIAGNOSIS — S2249XA Multiple fractures of ribs, unspecified side, initial encounter for closed fracture: Secondary | ICD-10-CM | POA: Insufficient documentation

## 2011-06-27 DIAGNOSIS — W19XXXA Unspecified fall, initial encounter: Secondary | ICD-10-CM | POA: Insufficient documentation

## 2011-06-27 DIAGNOSIS — G4700 Insomnia, unspecified: Secondary | ICD-10-CM | POA: Insufficient documentation

## 2011-06-27 DIAGNOSIS — I10 Essential (primary) hypertension: Secondary | ICD-10-CM | POA: Insufficient documentation

## 2011-06-27 DIAGNOSIS — Z87891 Personal history of nicotine dependence: Secondary | ICD-10-CM | POA: Insufficient documentation

## 2011-06-27 DIAGNOSIS — J449 Chronic obstructive pulmonary disease, unspecified: Secondary | ICD-10-CM | POA: Insufficient documentation

## 2011-06-27 DIAGNOSIS — E7800 Pure hypercholesterolemia, unspecified: Secondary | ICD-10-CM | POA: Insufficient documentation

## 2011-06-27 DIAGNOSIS — J4489 Other specified chronic obstructive pulmonary disease: Secondary | ICD-10-CM | POA: Insufficient documentation

## 2011-06-27 DIAGNOSIS — K219 Gastro-esophageal reflux disease without esophagitis: Secondary | ICD-10-CM | POA: Insufficient documentation

## 2011-06-27 DIAGNOSIS — S2239XA Fracture of one rib, unspecified side, initial encounter for closed fracture: Secondary | ICD-10-CM

## 2011-06-27 MED ORDER — PREDNISONE 10 MG PO TABS: 40.0000 mg | ORAL_TABLET | Freq: Every day | ORAL | Status: DC

## 2011-06-27 NOTE — ED Notes (Signed)
Lt knee "gave way" causing him to fall. Struck lt flank area and lt postero-lat ribs against a table.  Contusion present and a "knot".

## 2011-06-27 NOTE — Discharge Instructions (Signed)
Be sure you are taking deep breaths frequently to prevent getting pneumonia.  Rib Fracture Your caregiver has diagnosed you as having a rib fracture (a break). This can occur by a blow to the chest, by a fall against a hard object, or by violent coughing or sneezing. There may be one or many breaks. Rib fractures may heal on their own within 3 to 8 weeks. The longer healing period is usually associated with a continued cough or other aggravating activities. HOME CARE INSTRUCTIONS   Avoid strenuous activity. Be careful during activities and avoid bumping the injured rib. Activities that cause pain pull on the fracture site(s) and are best avoided if possible.   Eat a normal, well-balanced diet. Drink plenty of fluids to avoid constipation.   Take deep breaths several times a day to keep lungs free of infection. Try to cough several times a day, splinting the injured area with a pillow. This will help prevent pneumonia.   Do not wear a rib belt or binder. These restrict breathing which can lead to pneumonia.   Only take over-the-counter or prescription medicines for pain, discomfort, or fever as directed by your caregiver.  SEEK MEDICAL CARE IF:  You develop a continual cough, associated with thick or bloody sputum. SEEK IMMEDIATE MEDICAL CARE IF:   You have a fever.   You have difficulty breathing.   You have nausea (feeling sick to your stomach), vomiting, or abdominal (belly) pain.   You have worsening pain, not controlled with medications.  Document Released: 04/04/2005 Document Revised: 03/24/2011 Document Reviewed: 09/06/2006 [facility] Patient Information 2012 [place], [place].

## 2011-06-27 NOTE — ED Provider Notes (Signed)
History     CSN: [id number]  Arrival date & time 06/27/11  1135   None     Chief Complaint  Patient presents with  . Flank Pain    HPI Arien Benincasa is a 78 y.o. male who appears younger than his stated age. Ppresents to the ED for flank pain that started 2 weeks ago after hitting left side on a dresser at home. Has continued to have pain since the injury. Denies any other problems. The history was provided by the patient.  Past Medical History  Diagnosis Date  . Hypertension   . Hypercholesterolemia   . GERD (gastroesophageal reflux disease)   . COPD (chronic obstructive pulmonary disease)   . Chronic insomnia     Past Surgical History  Procedure Date  . Colonoscopy 03/31/10    Dr. Darrick Penna  . Right knee     x3  . Nose surgery     History reviewed. No pertinent family history.  History  Substance Use Topics  . Smoking status: Former Smoker -- 0.2 packs/day for 55 years    Types: Cigarettes  . Smokeless tobacco: Not on file  . Alcohol Use: 1.2 oz/week    2 Shots of liquor per week      Review of Systems  Constitutional: Negative for fever and chills.  HENT: Negative for neck pain.   Eyes: Negative for photophobia, pain and discharge.  Respiratory: Negative for cough, chest tightness and wheezing.   Gastrointestinal: Negative for nausea, vomiting, abdominal pain, diarrhea and constipation.  Genitourinary: Negative for frequency and difficulty urinating.  Musculoskeletal: Positive for back pain. Negative for myalgias and gait problem.       Left rib pain.  Skin: Negative for color change and rash.  Neurological: Negative for dizziness, speech difficulty, weakness, light-headedness, numbness and headaches.  Psychiatric/Behavioral: Negative for confusion and agitation.    Allergies  Aspirin and Penicillins  Home Medications   Current Outpatient Rx  Name Route Sig Dispense Refill  . ACETAMINOPHEN 500 MG PO TABS Oral Take 500 mg by mouth every 6 (six) hours  as needed. Pain     . ATORVASTATIN CALCIUM 10 MG PO TABS Oral Take 10 mg by mouth daily.      Marland Kitchen CALCIUM CARBONATE 600 MG PO TABS Oral Take 600 mg by mouth daily.      Marland Kitchen ESOMEPRAZOLE MAGNESIUM 40 MG PO CPDR Oral Take 40 mg by mouth daily before breakfast.      . FLUTICASONE-SALMETEROL 250-50 MCG/DOSE IN AEPB Inhalation Inhale 1 puff into the lungs every 12 (twelve) hours. AS DIRECTED     . HYPROMELLOSE 2.5 % OP SOLN Both Eyes Place 1 drop into both eyes daily as needed. Dry Eyes     . LISINOPRIL-HYDROCHLOROTHIAZIDE 10-12.5 MG PO TABS Oral Take 1 tablet by mouth daily.      Marland Kitchen LOPERAMIDE HCL 2 MG PO TABS Oral Take 2 mg by mouth 4 (four) times daily as needed.      Marland Kitchen LORAZEPAM 0.5 MG PO TABS Oral Take 0.5 mg by mouth 2 (two) times daily. Anxiety     . QUETIAPINE FUMARATE 25 MG PO TABS Oral Take 25 mg by mouth at bedtime.      . TAMSULOSIN HCL 0.4 MG PO CAPS Oral Take 0.4 mg by mouth at bedtime.        BP 154/93  Pulse 90  Temp(Src) 97.8 F (36.6 C) (Oral)  Resp 16  Ht 5\' 10"  (1.778 m)  Wt 150 lb (  68.04 kg)  BMI 21.52 kg/m2  SpO2 99%  Physical Exam  Nursing note and vitals reviewed. Constitutional: He is oriented to person, place, and time. He appears well-developed and well-nourished. No distress.  HENT:  Head: Normocephalic.  Eyes: EOM are normal.  Neck: Normal range of motion. Neck supple.  Cardiovascular: Normal rate.   Pulmonary/Chest: Effort normal and breath sounds normal.       Left anterior rib pain with deformity noted. Ecchymosis noted left side.  Abdominal: Soft. There is no tenderness.  Musculoskeletal: Normal range of motion.  Neurological: He is alert and oriented to person, place, and time. No cranial nerve deficit.  Skin: Skin is warm and dry.  Psychiatric: He has a normal mood and affect. His behavior is normal. Judgment and thought content normal.    ED Course  Procedures (including critical care time)  Labs Reviewed - No data to display Dg Ribs Unilateral  W/chest Left  06/27/2011  *RADIOLOGY REPORT*  Clinical Data: Posterior left rib pain.  Fell 2 weeks ago.  LEFT RIBS AND CHEST - 3+ VIEW  Comparison: Chest dated 12/05/2010.  Findings: The heart remains normal in size.  The lungs remain hyperexpanded.  Interval small amount of pleural fluid and linear density at the left lateral lung base.  Diffuse osteopenia. Mildly displaced left seventh anterior rib fracture.  Possible minimally displaced left ninth and tenth anterior rib fractures.  No pneumothorax.  IMPRESSION:  1. Mildly displaced left seventh rib fracture and probable minimally displaced left ninth and tenth rib fractures. 2.  Small left pleural effusion and minimal adjacent atelectasis. 3.  Stable changes of COPD.  Original Report Authenticated By: Darrol Angel, M.D.    Assessment:  Left 9th and 10th rib fractures  Plan:  Prednisone Rx   Return as needed  MDM: discussed with dr. Colon Branch, EDP          Janne Napoleon, NP 06/27/11 1437

## 2011-06-29 NOTE — ED Provider Notes (Signed)
Medical screening examination/treatment/procedure(s) were performed by non-physician practitioner and as supervising physician I was immediately available for consultation/collaboration.  Avontae Burkhead S. Vivian Okelley, MD 06/29/11 1817 

## 2011-08-03 ENCOUNTER — Encounter (HOSPITAL_COMMUNITY): Payer: Self-pay | Admitting: Emergency Medicine

## 2011-08-03 ENCOUNTER — Emergency Department (HOSPITAL_COMMUNITY): Payer: Medicare Other

## 2011-08-03 ENCOUNTER — Inpatient Hospital Stay (HOSPITAL_COMMUNITY)
Admission: EM | Admit: 2011-08-03 | Discharge: 2011-08-06 | DRG: 194 | Disposition: A | Discharge status: Home / Self Care | Payer: Medicare Other | Attending: Internal Medicine | Admitting: Internal Medicine

## 2011-08-03 DIAGNOSIS — I10 Essential (primary) hypertension: Secondary | ICD-10-CM | POA: Diagnosis present

## 2011-08-03 DIAGNOSIS — E785 Hyperlipidemia, unspecified: Secondary | ICD-10-CM | POA: Diagnosis present

## 2011-08-03 DIAGNOSIS — R000 Tachycardia, unspecified: Secondary | ICD-10-CM | POA: Diagnosis present

## 2011-08-03 DIAGNOSIS — J4489 Other specified chronic obstructive pulmonary disease: Secondary | ICD-10-CM | POA: Diagnosis present

## 2011-08-03 DIAGNOSIS — E871 Hypo-osmolality and hyponatremia: Secondary | ICD-10-CM

## 2011-08-03 DIAGNOSIS — J189 Pneumonia, unspecified organism: Principal | ICD-10-CM | POA: Diagnosis present

## 2011-08-03 DIAGNOSIS — K219 Gastro-esophageal reflux disease without esophagitis: Secondary | ICD-10-CM | POA: Diagnosis present

## 2011-08-03 DIAGNOSIS — J449 Chronic obstructive pulmonary disease, unspecified: Secondary | ICD-10-CM | POA: Diagnosis present

## 2011-08-03 DIAGNOSIS — J9610 Chronic respiratory failure, unspecified whether with hypoxia or hypercapnia: Secondary | ICD-10-CM | POA: Diagnosis present

## 2011-08-03 DIAGNOSIS — D649 Anemia, unspecified: Secondary | ICD-10-CM | POA: Diagnosis present

## 2011-08-03 DIAGNOSIS — R0902 Hypoxemia: Secondary | ICD-10-CM | POA: Diagnosis present

## 2011-08-03 DIAGNOSIS — Z87891 Personal history of nicotine dependence: Secondary | ICD-10-CM

## 2011-08-03 HISTORY — DX: Anemia, unspecified: D64.9

## 2011-08-03 HISTORY — DX: Benign prostatic hyperplasia without lower urinary tract symptoms: N40.0

## 2011-08-03 LAB — BASIC METABOLIC PANEL
BUN: 16 mg/dL (ref 6–23)
Chloride: 94 mEq/L — ABNORMAL LOW (ref 96–112)
Creatinine, Ser: 0.61 mg/dL (ref 0.50–1.35)
GFR calc Af Amer: >90 mL/min (ref >90)
GFR calc non Af Amer: >90 mL/min (ref >90)
Potassium: 3.7 mEq/L (ref 3.5–5.1)

## 2011-08-03 LAB — DIFFERENTIAL
Basophils Absolute: 0 10*3/uL (ref 0.0–0.1)
Basophils Relative: 0 % (ref 0–1)
Eosinophils Absolute: 0 10*3/uL (ref 0.0–0.7)
Neutro Abs: 17.4 10*3/uL — ABNORMAL HIGH (ref 1.7–7.7)
Neutrophils Relative %: 95 % — ABNORMAL HIGH (ref 43–77)

## 2011-08-03 LAB — CBC
MCH: 32.7 pg (ref 26.0–34.0)
MCHC: 33.9 g/dL (ref 30.0–36.0)
RDW: 12.8 % (ref 11.5–15.5)

## 2011-08-03 LAB — URINALYSIS, ROUTINE W REFLEX MICROSCOPIC
Hgb urine dipstick: NEGATIVE
Ketones, ur: NEGATIVE mg/dL
Nitrite: NEGATIVE
Specific Gravity, Urine: 1.01 (ref 1.005–1.030)
pH: 8 (ref 5.0–8.0)

## 2011-08-03 LAB — CARDIAC PANEL(CRET KIN+CKTOT+MB+TROPI)
Relative Index: INVALID (ref 0.0–2.5)
Total CK: 37 U/L (ref 7–232)
Troponin I: <0.3 ng/mL (ref <0.30)

## 2011-08-03 LAB — URINE MICROSCOPIC-ADD ON

## 2011-08-03 MED ORDER — DEXTROSE 5 % IV SOLN
500.0000 mg | Freq: Once | INTRAVENOUS | Status: AC
  Administered 2011-08-03: 500 mg via INTRAVENOUS
  Filled 2011-08-03: qty 500

## 2011-08-03 MED ORDER — QUETIAPINE FUMARATE 25 MG PO TABS
25.0000 mg | ORAL_TABLET | Freq: Every day | ORAL | Status: DC
  Administered 2011-08-03 – 2011-08-05 (×3): 25 mg via ORAL
  Filled 2011-08-03 (×3): qty 1

## 2011-08-03 MED ORDER — TAMSULOSIN HCL 0.4 MG PO CAPS
0.4000 mg | ORAL_CAPSULE | Freq: Every day | ORAL | Status: DC
  Administered 2011-08-03 – 2011-08-05 (×3): 0.4 mg via ORAL
  Filled 2011-08-03 (×3): qty 1

## 2011-08-03 MED ORDER — DEXTROSE 5 % IV SOLN
500.0000 mg | INTRAVENOUS | Status: DC
  Administered 2011-08-04 – 2011-08-05 (×2): 500 mg via INTRAVENOUS
  Filled 2011-08-03 (×5): qty 500

## 2011-08-03 MED ORDER — SODIUM CHLORIDE 0.9 % IV BOLUS (SEPSIS)
500.0000 mL | Freq: Once | INTRAVENOUS | Status: AC
  Administered 2011-08-03: 16:00:00 via INTRAVENOUS

## 2011-08-03 MED ORDER — LISINOPRIL-HYDROCHLOROTHIAZIDE 10-12.5 MG PO TABS: 1.0000 | ORAL_TABLET | Freq: Every day | ORAL | Status: DC

## 2011-08-03 MED ORDER — POTASSIUM CHLORIDE IN NACL 20-0.9 MEQ/L-% IV SOLN
INTRAVENOUS | Status: DC
  Administered 2011-08-03 – 2011-08-04 (×2): via INTRAVENOUS
  Administered 2011-08-05: 1000 mL via INTRAVENOUS

## 2011-08-03 MED ORDER — ALBUTEROL SULFATE (5 MG/ML) 0.5% IN NEBU
5.0000 mg | INHALATION_SOLUTION | Freq: Once | RESPIRATORY_TRACT | Status: AC
  Administered 2011-08-03: 5 mg via RESPIRATORY_TRACT
  Filled 2011-08-03: qty 1

## 2011-08-03 MED ORDER — ONDANSETRON HCL 4 MG/2ML IJ SOLN: 4.0000 mg | INTRAMUSCULAR | Status: DC | PRN

## 2011-08-03 MED ORDER — CEFTRIAXONE SODIUM 1 G IJ SOLR
1.0000 g | Freq: Once | INTRAMUSCULAR | Status: AC
  Administered 2011-08-03: 1 g via INTRAVENOUS
  Filled 2011-08-03: qty 10

## 2011-08-03 MED ORDER — ALBUTEROL SULFATE (5 MG/ML) 0.5% IN NEBU: 2.5000 mg | INHALATION_SOLUTION | Freq: Four times a day (QID) | RESPIRATORY_TRACT | Status: DC | PRN

## 2011-08-03 MED ORDER — ENOXAPARIN SODIUM 40 MG/0.4ML ~~LOC~~ SOLN
40.0000 mg | SUBCUTANEOUS | Status: DC
  Administered 2011-08-03 – 2011-08-05 (×3): 40 mg via SUBCUTANEOUS
  Filled 2011-08-03 (×4): qty 0.4

## 2011-08-03 MED ORDER — IPRATROPIUM BROMIDE 0.02 % IN SOLN
0.5000 mg | Freq: Once | RESPIRATORY_TRACT | Status: AC
  Administered 2011-08-03: 0.5 mg via RESPIRATORY_TRACT
  Filled 2011-08-03: qty 2.5

## 2011-08-03 MED ORDER — TRAZODONE HCL 50 MG PO TABS
25.0000 mg | ORAL_TABLET | Freq: Every evening | ORAL | Status: DC | PRN
  Administered 2011-08-04: 25 mg via ORAL
  Filled 2011-08-03: qty 1

## 2011-08-03 MED ORDER — HYDROCHLOROTHIAZIDE 12.5 MG PO CAPS: 12.5000 mg | ORAL_CAPSULE | Freq: Every day | ORAL | Status: DC

## 2011-08-03 MED ORDER — ATORVASTATIN CALCIUM 10 MG PO TABS
10.0000 mg | ORAL_TABLET | Freq: Every day | ORAL | Status: DC
  Administered 2011-08-04 – 2011-08-06 (×3): 10 mg via ORAL
  Filled 2011-08-03 (×3): qty 1

## 2011-08-03 MED ORDER — LISINOPRIL 10 MG PO TABS
10.0000 mg | ORAL_TABLET | Freq: Every day | ORAL | Status: DC
  Administered 2011-08-05: 10 mg via ORAL
  Filled 2011-08-03: qty 1

## 2011-08-03 MED ORDER — ACETAMINOPHEN 650 MG RE SUPP: 650.0000 mg | Freq: Four times a day (QID) | RECTAL | Status: DC | PRN

## 2011-08-03 MED ORDER — LORAZEPAM 0.5 MG PO TABS
0.5000 mg | ORAL_TABLET | Freq: Two times a day (BID) | ORAL | Status: DC
  Administered 2011-08-03 – 2011-08-06 (×6): 0.5 mg via ORAL
  Filled 2011-08-03 (×6): qty 1

## 2011-08-03 MED ORDER — ONDANSETRON HCL 4 MG PO TABS: 4.0000 mg | ORAL_TABLET | Freq: Four times a day (QID) | ORAL | Status: DC | PRN

## 2011-08-03 MED ORDER — ACETAMINOPHEN 325 MG PO TABS
650.0000 mg | ORAL_TABLET | ORAL | Status: DC | PRN
  Administered 2011-08-04: 650 mg via ORAL
  Filled 2011-08-03: qty 2

## 2011-08-03 MED ORDER — POLYVINYL ALCOHOL 1.4 % OP SOLN: 1.0000 [drp] | Freq: Every day | OPHTHALMIC | Status: DC | PRN

## 2011-08-03 MED ORDER — GUAIFENESIN ER 600 MG PO TB12
1200.0000 mg | ORAL_TABLET | Freq: Two times a day (BID) | ORAL | Status: DC
  Administered 2011-08-03 – 2011-08-06 (×6): 1200 mg via ORAL
  Filled 2011-08-03 (×6): qty 2

## 2011-08-03 MED ORDER — ONDANSETRON HCL 4 MG/2ML IJ SOLN
4.0000 mg | Freq: Once | INTRAMUSCULAR | Status: AC
  Administered 2011-08-03: 4 mg via INTRAVENOUS
  Filled 2011-08-03: qty 2

## 2011-08-03 MED ORDER — DEXTROSE 5 % IV SOLN
1.0000 g | INTRAVENOUS | Status: DC
  Administered 2011-08-04 – 2011-08-05 (×2): 1 g via INTRAVENOUS
  Filled 2011-08-03 (×4): qty 10

## 2011-08-03 MED ORDER — HYPROMELLOSE (GONIOSCOPIC) 2.5 % OP SOLN: 1.0000 [drp] | Freq: Every day | OPHTHALMIC | Status: DC | PRN

## 2011-08-03 MED ORDER — FLUTICASONE-SALMETEROL 250-50 MCG/DOSE IN AEPB
1.0000 | INHALATION_SPRAY | Freq: Two times a day (BID) | RESPIRATORY_TRACT | Status: DC
  Administered 2011-08-04 – 2011-08-06 (×4): 1 via RESPIRATORY_TRACT
  Filled 2011-08-03: qty 14

## 2011-08-03 MED ORDER — BENZONATATE 100 MG PO CAPS
200.0000 mg | ORAL_CAPSULE | Freq: Three times a day (TID) | ORAL | Status: DC | PRN
Start: 2011-08-03 — End: 2011-08-06

## 2011-08-03 MED ORDER — SODIUM CHLORIDE 0.9 % IV SOLN
INTRAVENOUS | Status: DC
  Administered 2011-08-03: 17:00:00 via INTRAVENOUS

## 2011-08-03 NOTE — ED Notes (Signed)
Patient arrives via EMS from home with c/o COPD exacerbation that started last night. Patient reports breathing tx x 2 without relief today. Patient given 2 L O2 via [place] by EMS, reports some relief with that.

## 2011-08-03 NOTE — ED Notes (Signed)
Patient O2 sats 88% Room air. Patient resting in bed. Placed patient back on 2L O2 via nasal cannula. O2 sats increased to 95%.

## 2011-08-03 NOTE — H&P (Signed)
PCP:   Cleaster Corin, MD,  [facility]  Chief Complaint:  Cough for one week  HPI: John Chambers is an 78 y.o. male.  Thin Caucasian gentleman, lives alone, COPD on nebulizer,  has been coughing for the past week relieved by over-the-counter cough medicine; yellow sputum; shortness of breath; chills last night; progressively weaker. In the emergency room today chest x-ray shows right lower lobe pneumonia. Denies sick contacts; denies antibiotic use;  Nocturia; does not take his prescribed Flomax regularly; Depression since the death of his wife; Denies nausea vomiting black or bloody stool. Because of increasing weakness has been having some falls.  Rewiew of Systems:  The patient denies anorexia, fever, weight loss,, vision loss, decreased hearing, hoarseness, chest pain, syncope, , peripheral edema, balance deficits, hemoptysis, abdominal pain, melena, hematochezia, severe indigestion/heartburn, hematuria, incontinence, genital sores, muscle weakness, suspicious skin lesions, transient blindness, difficulty walking, depression, unusual weight change, abnormal bleeding, enlarged lymph nodes, angioedema, and breast masses.    Past Medical History  Diagnosis Date  . Hypertension   . Hypercholesterolemia   . GERD (gastroesophageal reflux disease)   . COPD (chronic obstructive pulmonary disease)   . Chronic insomnia   . BPH (benign prostatic hyperplasia)     Past Surgical History  Procedure Date  . Colonoscopy 03/31/10    Dr. Darrick Penna  . Right knee     x3  . Nose surgery     Medications:  HOME MEDS: Prior to Admission medications   Medication Sig Start Date End Date Taking? Authorizing Provider  acetaminophen (TYLENOL) 500 MG tablet Take 500 mg by mouth every 6 (six) hours as needed. Pain    Yes Historical Provider, MD  albuterol (PROVENTIL) (2.5 MG/3ML) 0.083% nebulizer solution Take 2.5 mg by nebulization every 6 (six) hours as needed. For shortness  Of breath   Yes Historical  Provider, MD  atorvastatin (LIPITOR) 10 MG tablet Take 10 mg by mouth daily.     Yes Historical Provider, MD  calcium carbonate (OS-CAL) 600 MG TABS Take 600 mg by mouth daily.     Yes Historical Provider, MD  Fluticasone-Salmeterol (ADVAIR DISKUS) 250-50 MCG/DOSE AEPB Inhale 1 puff into the lungs every 12 (twelve) hours. AS DIRECTED    Yes Historical Provider, MD  hydroxypropyl methylcellulose (ISOPTO TEARS) 2.5 % ophthalmic solution Place 1 drop into both eyes daily as needed. Dry Eyes    Yes Historical Provider, MD  lisinopril-hydrochlorothiazide (PRINZIDE,ZESTORETIC) 10-12.5 MG per tablet Take 1 tablet by mouth daily.     Yes Historical Provider, MD  LORazepam (ATIVAN) 0.5 MG tablet Take 0.5 mg by mouth 2 (two) times daily. Anxiety    Yes Historical Provider, MD  QUEtiapine (SEROQUEL) 25 MG tablet Take 25 mg by mouth at bedtime.     Yes Historical Provider, MD  Tamsulosin HCl (FLOMAX) 0.4 MG CAPS Take 0.4 mg by mouth at bedtime. **Does not take every night**   Yes Historical Provider, MD     Allergies:  Allergies  Allergen Reactions  . Milk-Related Compounds Other (See Comments)    Gi UPSET "SOMETIMES"  . Aspirin Itching, Swelling, Rash and Other (See Comments)    GI upset  . Penicillins Rash    Social History:   reports that he has quit smoking. His smoking use included Cigarettes. He has a 13.75 pack-year smoking history. He does not have any smokeless tobacco history on file. He reports that he drinks about 1.2 ounces of alcohol per week. He reports that he does not use  illicit drugs.  Family History: No family history on file.   Physical Exam: Filed Vitals:   08/03/11 1442 08/03/11 1747  BP: 139/94   Pulse: 104   Temp: 99.8 F (37.7 C)   TempSrc: Oral   SpO2: 95% 95%   Blood pressure 139/94, pulse 104, temperature 99.8 F (37.7 C), temperature source Oral, SpO2 95.00%.  GEN:  Pleasant  elderly Caucasian gentleman lying in the stretcher in no acute distress;  cooperative with exam PSYCH:  alert and oriented x4; does not appear anxious or depressed; affect is appropriate. HEENT: Mucous membranes pink, dry , and anicteric; PERRLA; EOM intact; no cervical lymphadenopathy nor thyromegaly Breasts:: Not examined CHEST WALL: Muscle wasting; barrel chest; No tenderness CHEST:  diffuse rhonchi,  decreased breath sounds right chest HEART: Regular rate and rhythm; no murmurs rubs or gallops BACK:  mild  kyphosis ; no CVA tenderness ABDOMEN:  scaphoid , soft non-tender; no masses, no organomegaly, normal abdominal bowel sounds; no pannus; no intertriginous candida. Rectal Exam: Not done EXTREMITIES:  age-appropriate arthropathy of the hands and knees; no edema; Genitalia: not examined PULSES: 2+ and symmetric SKIN: Normal hydration no rash or ulceration CNS: Cranial nerves 2-12 grossly intact no focal lateralizing neurologic deficit   Labs & Imaging Results for orders placed during the hospital encounter of 08/03/11 (from the past 48 hour(s))  CBC     Status: Abnormal   Collection Time   08/03/11  4:21 PM      Component Value Range Comment   WBC 18.4 (*) 4.0 - 10.5 (K/uL)    RBC 4.04 (*) 4.22 - 5.81 (MIL/uL)    Hemoglobin 13.2  13.0 - 17.0 (g/dL)    HCT 16.1 (*) 09.6 - 52.0 (%)    MCV 96.3  78.0 - 100.0 (fL)    MCH 32.7  26.0 - 34.0 (pg)    MCHC 33.9  30.0 - 36.0 (g/dL)    RDW 04.5  40.9 - 81.1 (%)    Platelets 185  150 - 400 (K/uL)   DIFFERENTIAL     Status: Abnormal   Collection Time   08/03/11  4:21 PM      Component Value Range Comment   Neutrophils Relative 95 (*) 43 - 77 (%)    Neutro Abs 17.4 (*) 1.7 - 7.7 (K/uL)    Lymphocytes Relative 2 (*) 12 - 46 (%)    Lymphs Abs 0.3 (*) 0.7 - 4.0 (K/uL)    Monocytes Relative 3  3 - 12 (%)    Monocytes Absolute 0.6  0.1 - 1.0 (K/uL)    Eosinophils Relative 0  0 - 5 (%)    Eosinophils Absolute 0.0  0.0 - 0.7 (K/uL)    Basophils Relative 0  0 - 1 (%)    Basophils Absolute 0.0  0.0 - 0.1 (K/uL)     BASIC METABOLIC PANEL     Status: Abnormal   Collection Time   08/03/11  4:21 PM      Component Value Range Comment   Sodium 130 (*) 135 - 145 (mEq/L)    Potassium 3.7  3.5 - 5.1 (mEq/L)    Chloride 94 (*) 96 - 112 (mEq/L)    CO2 24  19 - 32 (mEq/L)    Glucose, Bld 107 (*) 70 - 99 (mg/dL)    BUN 16  6 - 23 (mg/dL)    Creatinine, Ser 9.14  0.50 - 1.35 (mg/dL)    Calcium 9.4  8.4 - 10.5 (mg/dL)  GFR calc non Af Amer >90  >90 (mL/min)    GFR calc Af Amer >90  >90 (mL/min)   CARDIAC PANEL(CRET KIN+CKTOT+MB+TROPI)     Status: Normal   Collection Time   08/03/11  4:21 PM      Component Value Range Comment   Total CK 37  7 - 232 (U/L)    CK, MB 1.2  0.3 - 4.0 (ng/mL)    Troponin I <0.30  <0.30 (ng/mL)    Relative Index RELATIVE INDEX IS INVALID  0.0 - 2.5    URINALYSIS, ROUTINE W REFLEX MICROSCOPIC     Status: Abnormal   Collection Time   08/03/11  6:17 PM      Component Value Range Comment   Color, Urine AMBER (*) YELLOW  BIOCHEMICALS MAY BE AFFECTED BY COLOR   APPearance CLEAR  CLEAR     Specific Gravity, Urine 1.010  1.005 - 1.030     pH 8.0  5.0 - 8.0     Glucose, UA NEGATIVE  NEGATIVE (mg/dL)    Hgb urine dipstick NEGATIVE  NEGATIVE     Bilirubin Urine SMALL (*) NEGATIVE     Ketones, ur NEGATIVE  NEGATIVE (mg/dL)    Protein, ur TRACE (*) NEGATIVE (mg/dL)    Urobilinogen, UA 1.0  0.0 - 1.0 (mg/dL)    Nitrite NEGATIVE  NEGATIVE     Leukocytes, UA NEGATIVE  NEGATIVE    URINE MICROSCOPIC-ADD ON     Status: Abnormal   Collection Time   08/03/11  6:17 PM      Component Value Range Comment   Squamous Epithelial / LPF RARE  RARE     WBC, UA 0-2  <3 (WBC/hpf)    RBC / HPF 3-6  <3 (RBC/hpf)    Bacteria, UA FEW (*) RARE     Dg Chest 2 View  08/03/2011  *RADIOLOGY REPORT*  Clinical Data: Rule out pneumonia.  COPD exacerbation  CHEST - 2 VIEW  Comparison: 06/27/2011  Findings:  Heart size is normal.  No pleural effusion or edema.  There is airspace consolidation involving the right  lower lobe. This is new from previous exam.  The visualized osseous structures are unremarkable.  IMPRESSION:  1.  Right lower lobe airspace consolidation  Original Report Authenticated By: Rosealee Albee, M.D.      Assessment Present on Admission:  .Pneumonia, organism unspecified, community-acquired  .HTN (hypertension) .Hyperlipidemia .COPD (chronic obstructive pulmonary disease) .GERD (gastroesophageal reflux disease)  Hyponatremia  PLAN:  Admit this gentleman for initiation of intravenous antibiotics Nebulization   continue management of chronic medical conditions, but temporarily will hold his inhibitors and HCTZ since he is mildly dehydrated.  Early ambulation  Other plans as per orders.   Addysin Porco 08/03/2011, 8:01 PM

## 2011-08-03 NOTE — ED Notes (Signed)
Attempted to call report, nurse stated she was giving some medicine and would call me back "in a couple of minutes."

## 2011-08-03 NOTE — ED Notes (Signed)
Dr Campbell at bedside.

## 2011-08-03 NOTE — ED Provider Notes (Addendum)
History   This chart was scribed for Donnetta Hutching, MD by Sofie Rower. The patient was seen in room APA07/APA07 and the patient's care was started at 3:18 PM     CSN: [id number]  Arrival date & time 08/03/11  1438   First MD Initiated Contact with Patient 08/03/11 1514      Chief Complaint  Patient presents with  . COPD    (Consider location/radiation/quality/duration/timing/severity/associated sxs/prior treatment) HPI  John Chambers is a 78 y.o. male who, brought by EMS, presents to the Emergency Department complaining of moderate, episodic COPD onset yesterday with associated symptoms of chills, difficulty breathing, difficulty walking, increased urinary frequency, weakness. The pt states "he is so weak he cannot walk." Pt informs EDP that he "has recently traveled to [place] to see his son, he did not dress appropriately for the weather". Modifying factors include nebulizer treatment with moderate relief, breathing of oxygen with moderate relief. Pt has a hx of COPD, hypertension, allergies to milk related compounds, penicillins, aspirin. Pt denies breathing oxygen at home, previous x-rays of his lungs, previous application of breathing treatments.   Pt resides in [place], [place].     Past Medical History  Diagnosis Date  . Hypertension   . Hypercholesterolemia   . GERD (gastroesophageal reflux disease)   . COPD (chronic obstructive pulmonary disease)   . Chronic insomnia     Past Surgical History  Procedure Date  . Colonoscopy 03/31/10    Dr. Darrick Penna  . Right knee     x3  . Nose surgery      History  Substance Use Topics  . Smoking status: Former Smoker -- 0.2 packs/day for 55 years    Types: Cigarettes  . Smokeless tobacco: Not on file  . Alcohol Use: 1.2 oz/week    2 Shots of liquor per week      Review of Systems  All other systems reviewed and are negative.    10 Systems reviewed and all are negative for acute change except as noted in the  HPI.    Allergies  Milk-related compounds; Aspirin; and Penicillins  Home Medications   Current Outpatient Rx  Name Route Sig Dispense Refill  . ACETAMINOPHEN 500 MG PO TABS Oral Take 500 mg by mouth every 6 (six) hours as needed. Pain     . ALBUTEROL SULFATE (2.5 MG/3ML) 0.083% IN NEBU Nebulization Take 2.5 mg by nebulization every 6 (six) hours as needed. For shortness  Of breath    . ATORVASTATIN CALCIUM 10 MG PO TABS Oral Take 10 mg by mouth daily.      Marland Kitchen CALCIUM CARBONATE 600 MG PO TABS Oral Take 600 mg by mouth daily.      Marland Kitchen FLUTICASONE-SALMETEROL 250-50 MCG/DOSE IN AEPB Inhalation Inhale 1 puff into the lungs every 12 (twelve) hours. AS DIRECTED     . HYPROMELLOSE 2.5 % OP SOLN Both Eyes Place 1 drop into both eyes daily as needed. Dry Eyes     . LISINOPRIL-HYDROCHLOROTHIAZIDE 10-12.5 MG PO TABS Oral Take 1 tablet by mouth daily.      Marland Kitchen LORAZEPAM 0.5 MG PO TABS Oral Take 0.5 mg by mouth 2 (two) times daily. Anxiety     . QUETIAPINE FUMARATE 25 MG PO TABS Oral Take 25 mg by mouth at bedtime.      . TAMSULOSIN HCL 0.4 MG PO CAPS Oral Take 0.4 mg by mouth at bedtime. **Does not take every night**      BP 139/94  Pulse 104  Temp(Src) 99.8 F (37.7 C) (Oral)  SpO2 95%  Physical Exam  Nursing note and vitals reviewed. Constitutional: He is oriented to person, place, and time. He appears well-developed and well-nourished.  HENT:  Left Ear: External ear normal.  Nose: Nose normal.  Eyes: EOM are normal. Pupils are equal, round, and reactive to light.  Neck: Normal range of motion.  Cardiovascular: Normal rate.   Pulmonary/Chest: Effort normal. He has rales.  Musculoskeletal: Normal range of motion.  Neurological: He is alert and oriented to person, place, and time.  Skin: Skin is warm and dry. There is pallor.  Psychiatric: He has a normal mood and affect. His behavior is normal.    ED Course  Procedures (including critical care time)  DIAGNOSTIC STUDIES: Oxygen  Saturation is 95% on [place], adequate by my interpretation.    COORDINATION OF CARE:    Results for orders placed during the hospital encounter of 08/03/11  CBC      Component Value Range   WBC 18.4 (*) 4.0 - 10.5 (K/uL)   RBC 4.04 (*) 4.22 - 5.81 (MIL/uL)   Hemoglobin 13.2  13.0 - 17.0 (g/dL)   HCT 21.3 (*) 08.6 - 52.0 (%)   MCV 96.3  78.0 - 100.0 (fL)   MCH 32.7  26.0 - 34.0 (pg)   MCHC 33.9  30.0 - 36.0 (g/dL)   RDW 57.8  46.9 - 62.9 (%)   Platelets 185  150 - 400 (K/uL)  DIFFERENTIAL      Component Value Range   Neutrophils Relative 95 (*) 43 - 77 (%)   Neutro Abs 17.4 (*) 1.7 - 7.7 (K/uL)   Lymphocytes Relative 2 (*) 12 - 46 (%)   Lymphs Abs 0.3 (*) 0.7 - 4.0 (K/uL)   Monocytes Relative 3  3 - 12 (%)   Monocytes Absolute 0.6  0.1 - 1.0 (K/uL)   Eosinophils Relative 0  0 - 5 (%)   Eosinophils Absolute 0.0  0.0 - 0.7 (K/uL)   Basophils Relative 0  0 - 1 (%)   Basophils Absolute 0.0  0.0 - 0.1 (K/uL)  BASIC METABOLIC PANEL      Component Value Range   Sodium 130 (*) 135 - 145 (mEq/L)   Potassium 3.7  3.5 - 5.1 (mEq/L)   Chloride 94 (*) 96 - 112 (mEq/L)   CO2 24  19 - 32 (mEq/L)   Glucose, Bld 107 (*) 70 - 99 (mg/dL)   BUN 16  6 - 23 (mg/dL)   Creatinine, Ser 5.28  0.50 - 1.35 (mg/dL)   Calcium 9.4  8.4 - 41.3 (mg/dL)   GFR calc non Af Amer >90  >90 (mL/min)   GFR calc Af Amer >90  >90 (mL/min)  CARDIAC PANEL(CRET KIN+CKTOT+MB+TROPI)      Component Value Range   Total CK 37  7 - 232 (U/L)   CK, MB 1.2  0.3 - 4.0 (ng/mL)   Troponin I <0.30  <0.30 (ng/mL)   Relative Index RELATIVE INDEX IS INVALID  0.0 - 2.5   URINALYSIS, ROUTINE W REFLEX MICROSCOPIC      Component Value Range   Color, Urine AMBER (*) YELLOW    APPearance CLEAR  CLEAR    Specific Gravity, Urine 1.010  1.005 - 1.030    pH 8.0  5.0 - 8.0    Glucose, UA NEGATIVE  NEGATIVE (mg/dL)   Hgb urine dipstick NEGATIVE  NEGATIVE    Bilirubin Urine SMALL (*) NEGATIVE    Ketones, ur NEGATIVE  NEGATIVE (mg/dL)    Protein, ur TRACE (*) NEGATIVE (mg/dL)   Urobilinogen, UA 1.0  0.0 - 1.0 (mg/dL)   Nitrite NEGATIVE  NEGATIVE    Leukocytes, UA NEGATIVE  NEGATIVE   URINE MICROSCOPIC-ADD ON      Component Value Range   Squamous Epithelial / LPF RARE  RARE    WBC, UA 0-2  <3 (WBC/hpf)   RBC / HPF 3-6  <3 (RBC/hpf)   Bacteria, UA FEW (*) RARE    Dg Chest 2 View  08/03/2011  *RADIOLOGY REPORT*  Clinical Data: Rule out pneumonia.  COPD exacerbation  CHEST - 2 VIEW  Comparison: 06/27/2011  Findings:  Heart size is normal.  No pleural effusion or edema.  There is airspace consolidation involving the right lower lobe. This is new from previous exam.  The visualized osseous structures are unremarkable.  IMPRESSION:  1.  Right lower lobe airspace consolidation  Original Report Authenticated By: Rosealee Albee, M.D.      No diagnosis found.  3:25PM- EDP at bedside discusses treatment plan concerning x-ray of the lungs, and breathing treatment.   MDM  Patient is hemodynamically stable. However he has long-term COPD and right lower lobe pneumonia.  Rx albuterol Atrovent breathing treatments, IV Zithromax and Rocephin, admit to triad hospitalist        I personally performed the services described in this documentation, which was scribed in my presence. The recorded information has been reviewed and considered.     Donnetta Hutching, MD 08/03/11 Ninfa Linden  Donnetta Hutching, MD 08/03/11 [phone]

## 2011-08-04 DIAGNOSIS — E871 Hypo-osmolality and hyponatremia: Secondary | ICD-10-CM | POA: Diagnosis present

## 2011-08-04 LAB — BASIC METABOLIC PANEL
BUN: 19 mg/dL (ref 6–23)
CO2: 27 mEq/L (ref 19–32)
Calcium: 9 mg/dL (ref 8.4–10.5)
Creatinine, Ser: 0.75 mg/dL (ref 0.50–1.35)
GFR calc Af Amer: >90 mL/min (ref >90)

## 2011-08-04 LAB — CBC
MCHC: 34.6 g/dL (ref 30.0–36.0)
MCV: 97.5 fL (ref 78.0–100.0)
Platelets: 183 10*3/uL (ref 150–400)
RDW: 13 % (ref 11.5–15.5)
WBC: 22.6 10*3/uL — ABNORMAL HIGH (ref 4.0–10.5)

## 2011-08-04 LAB — COMPREHENSIVE METABOLIC PANEL
ALT: 8 U/L (ref 0–53)
Alkaline Phosphatase: 132 U/L — ABNORMAL HIGH (ref 39–117)
BUN: 18 mg/dL (ref 6–23)
CO2: 24 mEq/L (ref 19–32)
Chloride: 97 mEq/L (ref 96–112)
GFR calc Af Amer: >90 mL/min (ref >90)
GFR calc non Af Amer: 87 mL/min — ABNORMAL LOW (ref >90)
Glucose, Bld: 99 mg/dL (ref 70–99)
Potassium: 3.9 mEq/L (ref 3.5–5.1)
Total Bilirubin: 0.6 mg/dL (ref 0.3–1.2)
Total Protein: 5.9 g/dL — ABNORMAL LOW (ref 6.0–8.3)

## 2011-08-04 NOTE — Progress Notes (Signed)
Subjective: (The patient was seen this morning. This is a delayed entry.) The patient says he feels better. He has less shortness of breath. He continues to have a cough. He denies chest pain.  Objective: Vital signs in last 24 hours: Filed Vitals:   08/04/11 0637 08/04/11 1006 08/04/11 1358 08/04/11 1900  BP: 93/58 96/59 98/62  92/58  Pulse: 76 97 90 86  Temp: 98.3 F (36.8 C) 98 F (36.7 C) 99.4 F (37.4 C) 98 F (36.7 C)  TempSrc: Oral     Resp: 20 18 19 20   Height:      Weight:      SpO2: 96% 90% 98% 96%    Intake/Output Summary (Last 24 hours) at 08/04/11 1921 Last data filed at 08/04/11 1700  Gross per 24 hour  Intake 984.93 ml  Output    200 ml  Net 784.93 ml    Weight change:   Physical exam: Gen.: Pleasant 78 year old Caucasian man sitting up in bed, in no acute distress. Lungs: A few crackles auscultated bilaterally. Wet cough. Breathing nonlabored. Heart: Distant S1, S2, with a 1-2/6 systolic murmur. Abdomen: Positive bowel sounds, soft, nontender, nondistended. Extremities: No pedal edema.  Lab Results: Basic Metabolic Panel:  Basename 08/04/11 0606 08/03/11 1621  NA 131* 130*  K 4.0 3.7  CL 95* 94*  CO2 27 24  GLUCOSE 97 107*  BUN 19 16  CREATININE 0.75 0.61  CALCIUM 9.0 9.4  MG -- --  PHOS -- --   Liver Function Tests: No results found for this basename: AST:2,ALT:2,ALKPHOS:2,BILITOT:2,PROT:2,ALBUMIN:2 in the last 72 hours No results found for this basename: LIPASE:2,AMYLASE:2 in the last 72 hours No results found for this basename: AMMONIA:2 in the last 72 hours CBC:  Basename 08/04/11 0606 08/03/11 1621  WBC 22.6* 18.4*  NEUTROABS -- 17.4*  HGB 11.9* 13.2  HCT 34.4* 38.9*  MCV 97.5 96.3  PLT 183 185   Cardiac Enzymes:  Basename 08/03/11 1621  CKTOTAL 37  CKMB 1.2  CKMBINDEX --  TROPONINI <0.30   BNP: No results found for this basename: PROBNP:3 in the last 72 hours D-Dimer: No results found for this basename: DDIMER:2 in  the last 72 hours CBG: No results found for this basename: GLUCAP:6 in the last 72 hours Hemoglobin A1C: No results found for this basename: HGBA1C in the last 72 hours Fasting Lipid Panel: No results found for this basename: CHOL,HDL,LDLCALC,TRIG,CHOLHDL,LDLDIRECT in the last 72 hours Thyroid Function Tests: No results found for this basename: TSH,T4TOTAL,FREET4,T3FREE,THYROIDAB in the last 72 hours Anemia Panel: No results found for this basename: VITAMINB12,FOLATE,FERRITIN,TIBC,IRON,RETICCTPCT in the last 72 hours Coagulation: No results found for this basename: LABPROT:2,INR:2 in the last 72 hours Urine Drug Screen: Drugs of Abuse  No results found for this basename: labopia, cocainscrnur, labbenz, amphetmu, thcu, labbarb    Alcohol Level: No results found for this basename: ETH:2 in the last 72 hours Urinalysis:  Basename 08/03/11 1817  COLORURINE AMBER*  LABSPEC 1.010  PHURINE 8.0  GLUCOSEU NEGATIVE  HGBUR NEGATIVE  BILIRUBINUR SMALL*  KETONESUR NEGATIVE  PROTEINUR TRACE*  UROBILINOGEN 1.0  NITRITE NEGATIVE  LEUKOCYTESUR NEGATIVE   Misc. Labs:   Micro: No results found for this or any previous visit (from the past 240 hour(s)).  Studies/Results: Dg Chest 2 View  08/03/2011  *RADIOLOGY REPORT*  Clinical Data: Rule out pneumonia.  COPD exacerbation  CHEST - 2 VIEW  Comparison: 06/27/2011  Findings:  Heart size is normal.  No pleural effusion or edema.  There is airspace  consolidation involving the right lower lobe. This is new from previous exam.  The visualized osseous structures are unremarkable.  IMPRESSION:  1.  Right lower lobe airspace consolidation  Original Report Authenticated By: Rosealee Albee, M.D.    Medications:  Scheduled:   . atorvastatin  10 mg Oral Daily  . azithromycin  500 mg Intravenous Q24H  . cefTRIAXone (ROCEPHIN)  IV  1 g Intravenous Q24H  . enoxaparin  40 mg Subcutaneous Q24H  . Fluticasone-Salmeterol  1 puff Inhalation Q12H  .  guaiFENesin  1,200 mg Oral BID  . lisinopril  10 mg Oral Daily  . LORazepam  0.5 mg Oral BID  . QUEtiapine  25 mg Oral QHS  . Tamsulosin HCl  0.4 mg Oral QHS  . DISCONTD: hydrochlorothiazide  12.5 mg Oral Daily  . DISCONTD: lisinopril-hydrochlorothiazide  1 tablet Oral Daily    Assessment: Active Problems:  HTN (hypertension)  Hyperlipidemia  COPD (chronic obstructive pulmonary disease)  GERD (gastroesophageal reflux disease)  Pneumonia, organism unspecified  Hyponatremia   Plan:  1. Will continue Rocephin and azithromycin. Continue guaifenesin and when necessary Tessalon Perles. Continue Advair. Continue as needed albuterol nebulization. 2. Because of hyponatremia, we'll discontinue hydrochlorothiazide and continue gentle IV fluid hydration with normal saline.     LOS: 1 day   Tracie Lindbloom 08/04/2011, 7:21 PM

## 2011-08-05 DIAGNOSIS — R0902 Hypoxemia: Secondary | ICD-10-CM | POA: Diagnosis present

## 2011-08-05 LAB — CBC
MCHC: 34.6 g/dL (ref 30.0–36.0)
Platelets: 160 10*3/uL (ref 150–400)
RDW: 12.8 % (ref 11.5–15.5)
WBC: 14.1 10*3/uL — ABNORMAL HIGH (ref 4.0–10.5)

## 2011-08-05 LAB — LEGIONELLA ANTIGEN, URINE

## 2011-08-05 MED ORDER — FUROSEMIDE 20 MG PO TABS
10.0000 mg | ORAL_TABLET | Freq: Every day | ORAL | Status: AC
  Administered 2011-08-05: 10 mg via ORAL
  Filled 2011-08-05: qty 1

## 2011-08-05 NOTE — Progress Notes (Signed)
Subjective: The patient says that he is breathing better. Has less chest congestion.  Objective: Vital signs in last 24 hours: Filed Vitals:   08/05/11 0853 08/05/11 1011 08/05/11 1017 08/05/11 1417  BP:  93/52  99/66  Pulse:    98  Temp:    98.3 F (36.8 C)  TempSrc:      Resp:    20  Height:      Weight:      SpO2: 95% 85% 96% 99%    Intake/Output Summary (Last 24 hours) at 08/05/11 1547 Last data filed at 08/05/11 0851  Gross per 24 hour  Intake 2452.5 ml  Output    500 ml  Net 1952.5 ml    Weight change: 1.4 kg (3 lb 1.4 oz)  Physical exam: Gen.: Pleasant 78 year old Caucasian man sitting up in bed, in no acute distress. Lungs: No audible crackles or wheezes. Wet cough. Breathing nonlabored. Heart: Distant S1, S2, with a 1-2/6 systolic murmur. Abdomen: Positive bowel sounds, soft, nontender, nondistended. Extremities: No pedal edema.  Lab Results: Basic Metabolic Panel:  Basename 08/04/11 1942 08/04/11 0606  NA 129* 131*  K 3.9 4.0  CL 97 95*  CO2 24 27  GLUCOSE 99 97  BUN 18 19  CREATININE 0.67 0.75  CALCIUM 8.7 9.0  MG -- --  PHOS -- --   Liver Function Tests:  [facility] 08/04/11 1942  AST 18  ALT 8  ALKPHOS 132*  BILITOT 0.6  PROT 5.9*  ALBUMIN 2.9*   No results found for this basename: LIPASE:2,AMYLASE:2 in the last 72 hours No results found for this basename: AMMONIA:2 in the last 72 hours CBC:  Basename 08/05/11 0525 08/04/11 0606 08/03/11 1621  WBC 14.1* 22.6* --  NEUTROABS -- -- 17.4*  HGB 11.3* 11.9* --  HCT 32.7* 34.4* --  MCV 98.8 97.5 --  PLT 160 183 --   Cardiac Enzymes:  Basename 08/03/11 1621  CKTOTAL 37  CKMB 1.2  CKMBINDEX --  TROPONINI <0.30   BNP: No results found for this basename: PROBNP:3 in the last 72 hours D-Dimer: No results found for this basename: DDIMER:2 in the last 72 hours CBG: No results found for this basename: GLUCAP:6 in the last 72 hours Hemoglobin A1C: No results found for this basename:  HGBA1C in the last 72 hours Fasting Lipid Panel: No results found for this basename: CHOL,HDL,LDLCALC,TRIG,CHOLHDL,LDLDIRECT in the last 72 hours Thyroid Function Tests: No results found for this basename: TSH,T4TOTAL,FREET4,T3FREE,THYROIDAB in the last 72 hours Anemia Panel: No results found for this basename: VITAMINB12,FOLATE,FERRITIN,TIBC,IRON,RETICCTPCT in the last 72 hours Coagulation: No results found for this basename: LABPROT:2,INR:2 in the last 72 hours Urine Drug Screen: Drugs of Abuse  No results found for this basename: labopia,  cocainscrnur,  labbenz,  amphetmu,  thcu,  labbarb    Alcohol Level: No results found for this basename: ETH:2 in the last 72 hours Urinalysis:  Basename 08/03/11 1817  COLORURINE AMBER*  LABSPEC 1.010  PHURINE 8.0  GLUCOSEU NEGATIVE  HGBUR NEGATIVE  BILIRUBINUR SMALL*  KETONESUR NEGATIVE  PROTEINUR TRACE*  UROBILINOGEN 1.0  NITRITE NEGATIVE  LEUKOCYTESUR NEGATIVE   Misc. Labs:   Micro: No results found for this or any previous visit (from the past 240 hour(s)).  Studies/Results: Dg Chest 2 View  08/03/2011  *RADIOLOGY REPORT*  Clinical Data: Rule out pneumonia.  COPD exacerbation  CHEST - 2 VIEW  Comparison: 06/27/2011  Findings:  Heart size is normal.  No pleural effusion or edema.  There is airspace  consolidation involving the right lower lobe. This is new from previous exam.  The visualized osseous structures are unremarkable.  IMPRESSION:  1.  Right lower lobe airspace consolidation  Original Report Authenticated By: Rosealee Albee, M.D.    Medications:  Scheduled:    . atorvastatin  10 mg Oral Daily  . azithromycin  500 mg Intravenous Q24H  . cefTRIAXone (ROCEPHIN)  IV  1 g Intravenous Q24H  . enoxaparin  40 mg Subcutaneous Q24H  . Fluticasone-Salmeterol  1 puff Inhalation Q12H  . furosemide  10 mg Oral Daily  . guaiFENesin  1,200 mg Oral BID  . LORazepam  0.5 mg Oral BID  . QUEtiapine  25 mg Oral QHS  . Tamsulosin  HCl  0.4 mg Oral QHS  . DISCONTD: hydrochlorothiazide  12.5 mg Oral Daily  . DISCONTD: lisinopril  10 mg Oral Daily    Assessment: Active Problems:  HTN (hypertension)  Hyperlipidemia  COPD (chronic obstructive pulmonary disease)  GERD (gastroesophageal reflux disease)  Pneumonia, organism unspecified  Hyponatremia  Hypoxia   1. Community acquired pneumonia. He is improving on antibiotics.  COPD, likely with chronic hypoxic respiratory failure/hypoxia His oxygen saturations fell to the 80s on room air. It is possible that this is not new. Nevertheless, he requires home oxygen. We'll continue Advair and when necessary nebulizations.  History of hypertension with low normal blood pressures. Hydrochlorothiazide was held yesterday. We'll hold lisinopril as well.  Hyponatremia. The patient may have an element of SIADH given that his serum sodium actually decreased following 24 hours of IV fluids. He is also receiving hypotonic solution in the IV antibiotics which may be contributing as well.    Plan:  1. Continue current management. 2. Will arrange for home oxygen as needed. 3. We'll decrease IV fluids. We'll give him one small dose of Lasix to see if it decreases free water and increases his serum sodium. 4. We'll hold lisinopril for now.    LOS: 2 days   John Chambers 08/05/2011, 3:47 PM

## 2011-08-05 NOTE — Progress Notes (Signed)
At 1011 px O2 saturation was 85% at rest on room air. Placed back on [place] 2L and px sat was 96% at rest.  Will continue to monitor.

## 2011-08-05 NOTE — Progress Notes (Signed)
   CARE MANAGEMENT NOTE 08/05/2011  Patient:  John Chambers, John Chambers   Account Number:  [id number]  Date Initiated:  08/05/2011  Documentation initiated by:  Rosemary Holms  Subjective/Objective Assessment:   Pt admitted with PNA secondary to COPD. PTA, lives at home alone but iwth family support     Action/Plan:   Anticipates DC back to home. DME O2 also anticipated due to [place]at to 85% on Room Air which was reported by RN. No other HH or DME needs identified   Anticipated DC Date:  08/05/2011   Anticipated DC Plan:  HOME W HOME HEALTH SERVICES      DC Planning Services  CM consult      Choice offered to / List presented to:     DME arranged  [facility]      DME agency  [facility]        Status of service:  In process, will continue to follow Medicare Important Message given?   (If response is "NO", the following Medicare IM given date fields will be blank) Date Medicare IM given:   Date Additional Medicare IM given:    Discharge Disposition:    Per UR Regulation:    If discussed at Long Length of Stay Meetings, dates discussed:    Comments:  08/05/11 1100 John Chambers John Hawking RN BSN CM

## 2011-08-06 ENCOUNTER — Encounter (HOSPITAL_COMMUNITY): Payer: Self-pay | Admitting: Internal Medicine

## 2011-08-06 DIAGNOSIS — D649 Anemia, unspecified: Secondary | ICD-10-CM

## 2011-08-06 HISTORY — DX: Anemia, unspecified: D64.9

## 2011-08-06 LAB — CBC
HCT: 31.4 % — ABNORMAL LOW (ref 39.0–52.0)
Hemoglobin: 10.7 g/dL — ABNORMAL LOW (ref 13.0–17.0)
MCHC: 34.1 g/dL (ref 30.0–36.0)
WBC: 9.2 10*3/uL (ref 4.0–10.5)

## 2011-08-06 LAB — BASIC METABOLIC PANEL
Chloride: 100 mEq/L (ref 96–112)
GFR calc Af Amer: >90 mL/min (ref >90)
GFR calc non Af Amer: 86 mL/min — ABNORMAL LOW (ref >90)
Potassium: 3.7 mEq/L (ref 3.5–5.1)
Sodium: 134 mEq/L — ABNORMAL LOW (ref 135–145)

## 2011-08-06 MED ORDER — CEFUROXIME AXETIL 500 MG PO TABS: 500.0000 mg | ORAL_TABLET | Freq: Two times a day (BID) | ORAL | Status: AC

## 2011-08-06 MED ORDER — LEVALBUTEROL TARTRATE 45 MCG/ACT IN AERO
1.0000 | INHALATION_SPRAY | Freq: Three times a day (TID) | RESPIRATORY_TRACT | Status: DC
  Administered 2011-08-06: 1 via RESPIRATORY_TRACT
  Filled 2011-08-06: qty 15

## 2011-08-06 MED ORDER — AZITHROMYCIN 250 MG PO TABS: ORAL_TABLET | ORAL | Status: AC

## 2011-08-06 MED ORDER — BENZONATATE 200 MG PO CAPS: 200.0000 mg | ORAL_CAPSULE | Freq: Three times a day (TID) | ORAL | Status: AC | PRN

## 2011-08-06 MED ORDER — GUAIFENESIN ER 600 MG PO TB12: 1200.0000 mg | ORAL_TABLET | Freq: Two times a day (BID) | ORAL | Status: DC | PRN

## 2011-08-06 MED ORDER — LEVALBUTEROL TARTRATE 45 MCG/ACT IN AERO: 1.0000 | INHALATION_SPRAY | Freq: Three times a day (TID) | RESPIRATORY_TRACT | Status: DC

## 2011-08-06 NOTE — Discharge Summary (Signed)
Physician Discharge Summary  John Chambers MRN: [id number] DOB/AGE: 09-17-1926 78 y.o.  PCP: Cleaster Corin, MD (in [place])   Admit date: 08/03/2011 Discharge date: 08/06/2011  Discharge Diagnoses:  1. Community acquired pneumonia. 2. COPD. 3. Hypoxia/likely chronic hypoxic respiratory failure. The patient was discharged to home on 2 L of nasal cannula oxygen. 4. Hyponatremia, and nearly resolved at the time of discharge. 5. Hypertension with low normal blood pressures during hospitalization. 6. GERD.  7. Anemia. Workup/evaluation deferred to his primary care physician.   Medication List  As of 08/06/2011 12:17 PM   TAKE these medications         acetaminophen 500 MG tablet   Commonly known as: TYLENOL   Take 500 mg by mouth every 6 (six) hours as needed. Pain      ADVAIR DISKUS 250-50 MCG/DOSE Aepb   Generic drug: Fluticasone-Salmeterol   Inhale 1 puff into the lungs every 12 (twelve) hours. AS DIRECTED      albuterol (2.5 MG/3ML) 0.083% nebulizer solution   Commonly known as: PROVENTIL   Take 2.5 mg by nebulization every 6 (six) hours as needed. For shortness  Of breath      atorvastatin 10 MG tablet   Commonly known as: LIPITOR   Take 10 mg by mouth daily.      azithromycin 250 MG tablet   Commonly known as: ZITHROMAX   Take 2 tablets the first day then one tablet daily thereafter until complete. (Antibiotic).      benzonatate 200 MG capsule   Commonly known as: TESSALON   Take 1 capsule (200 mg total) by mouth 3 (three) times daily as needed for cough.      calcium carbonate 600 MG Tabs   Commonly known as: OS-CAL   Take 600 mg by mouth daily.      cefUROXime 500 MG tablet   Commonly known as: CEFTIN   Take 1 tablet (500 mg total) by mouth 2 (two) times daily. Take for 5 more days. (Antibiotic).      guaiFENesin 600 MG 12 hr tablet   Commonly known as: MUCINEX   Take 2 tablets (1,200 mg total) by mouth 2 (two) times daily as needed for congestion.     hydroxypropyl methylcellulose 2.5 % ophthalmic solution   Commonly known as: ISOPTO TEARS   Place 1 drop into both eyes daily as needed. Dry Eyes      levalbuterol 45 MCG/ACT inhaler   Commonly known as: XOPENEX HFA   Inhale 1 puff into the lungs 3 (three) times daily.      lisinopril-hydrochlorothiazide 10-12.5 MG per tablet   Commonly known as: PRINZIDE,ZESTORETIC   Take 1 tablet by mouth daily.      LORazepam 0.5 MG tablet   Commonly known as: ATIVAN   Take 0.5 mg by mouth 2 (two) times daily. Anxiety      QUEtiapine 25 MG tablet   Commonly known as: SEROQUEL   Take 25 mg by mouth at bedtime.      Tamsulosin HCl 0.4 MG Caps   Commonly known as: FLOMAX   Take 0.4 mg by mouth at bedtime. **Does not take every night**            Discharge Condition: Improved and stable.  Disposition: 01-Home or Self Care   Consults: None.   Significant Diagnostic Studies: Dg Chest 2 View  08/03/2011  *RADIOLOGY REPORT*  Clinical Data: Rule out pneumonia.  COPD exacerbation  CHEST - 2 VIEW  Comparison:  06/27/2011  Findings:  Heart size is normal.  No pleural effusion or edema.  There is airspace consolidation involving the right lower lobe. This is new from previous exam.  The visualized osseous structures are unremarkable.  IMPRESSION:  1.  Right lower lobe airspace consolidation  Original Report Authenticated By: Rosealee Albee, M.D.     Microbiology: No results found for this or any previous visit (from the past 240 hour(s)).   Labs: Results for orders placed during the hospital encounter of 08/03/11 (from the past 48 hour(s))  COMPREHENSIVE METABOLIC PANEL     Status: Abnormal   Collection Time   08/04/11  7:42 PM      Component Value Range Comment   Sodium 129 (*) 135 - 145 (mEq/L)    Potassium 3.9  3.5 - 5.1 (mEq/L)    Chloride 97  96 - 112 (mEq/L)    CO2 24  19 - 32 (mEq/L)    Glucose, Bld 99  70 - 99 (mg/dL)    BUN 18  6 - 23 (mg/dL)    Creatinine, Ser 2.13  0.50 -  1.35 (mg/dL)    Calcium 8.7  8.4 - 10.5 (mg/dL)    Total Protein 5.9 (*) 6.0 - 8.3 (g/dL)    Albumin 2.9 (*) 3.5 - 5.2 (g/dL)    AST 18  0 - 37 (U/L)    ALT 8  0 - 53 (U/L)    Alkaline Phosphatase 132 (*) 39 - 117 (U/L)    Total Bilirubin 0.6  0.3 - 1.2 (mg/dL)    GFR calc non Af Amer 87 (*) >90 (mL/min)    GFR calc Af Amer >90  >90 (mL/min)   CBC     Status: Abnormal   Collection Time   08/05/11  5:25 AM      Component Value Range Comment   WBC 14.1 (*) 4.0 - 10.5 (K/uL)    RBC 3.31 (*) 4.22 - 5.81 (MIL/uL)    Hemoglobin 11.3 (*) 13.0 - 17.0 (g/dL)    HCT 08.6 (*) 57.8 - 52.0 (%)    MCV 98.8  78.0 - 100.0 (fL)    MCH 34.1 (*) 26.0 - 34.0 (pg)    MCHC 34.6  30.0 - 36.0 (g/dL)    RDW 46.9  62.9 - 52.8 (%)    Platelets 160  150 - 400 (K/uL)   BASIC METABOLIC PANEL     Status: Abnormal   Collection Time   08/06/11  6:37 AM      Component Value Range Comment   Sodium 134 (*) 135 - 145 (mEq/L)    Potassium 3.7  3.5 - 5.1 (mEq/L)    Chloride 100  96 - 112 (mEq/L)    CO2 28  19 - 32 (mEq/L)    Glucose, Bld 90  70 - 99 (mg/dL)    BUN 10  6 - 23 (mg/dL)    Creatinine, Ser 4.13  0.50 - 1.35 (mg/dL)    Calcium 8.9  8.4 - 10.5 (mg/dL)    GFR calc non Af Amer 86 (*) >90 (mL/min)    GFR calc Af Amer >90  >90 (mL/min)   CBC     Status: Abnormal   Collection Time   08/06/11  6:37 AM      Component Value Range Comment   WBC 9.2  4.0 - 10.5 (K/uL)    RBC 3.20 (*) 4.22 - 5.81 (MIL/uL)    Hemoglobin 10.7 (*) 13.0 - 17.0 (g/dL)  HCT 31.4 (*) 39.0 - 52.0 (%)    MCV 98.1  78.0 - 100.0 (fL)    MCH 33.4  26.0 - 34.0 (pg)    MCHC 34.1  30.0 - 36.0 (g/dL)    RDW 16.1  09.6 - 04.5 (%)    Platelets 203  150 - 400 (K/uL)      HPI : The patient is an 78 year old man with a history significant for COPD, GERD, and hypertension, who presented to the emergency department on April 17th 2013 with a chief complaint of cough and shortness of breath for one week. In the emergency department, he was noted  to be mildly tachycardic with a heart rate of 104 beats per minute, otherwise afebrile and hemodynamically stable. He was oxygenating 95% on nasal cannula oxygen. His lab data were significant for a WBC of 18.4, sodium of 130, and normal cardiac enzymes. His chest x-ray revealed right lower lobe airspace consolidation consistent with pneumonia. He was admitted for further evaluation and management.  HOSPITAL COURSE: The patient was started on treatment with IV Rocephin and azithromycin. IV fluids were started with normal saline as it was believed that the patient was volume depleted and his serum sodium was low at 130 on admission. Oxygen was titrated to keep his oxygen saturations greater than 92%. He was maintained on Advair diskus. Albuterol neb. was ordered when necessary. Symptomatic treatment was given with as needed Tessalon Perles and guaifenesin. Because of his hyponatremia, hydrochlorothiazide was discontinued. Lisinopril was eventually discontinued as his blood pressures were on the low normal side.  The patient was noted to be hypoxic off of oxygen supplementation. His oxygen saturations fell to the 70s transiently but stabilized at 85% on room air. Oxygen supplementation was prescribed for home. Of note, when he was placed back on 2 L of nasal cannula oxygen, his oxygen saturations increased to 96%.  The patient improved clinically and symptomatically. He remained afebrile. His white blood cell count normalized at 9.2 prior to discharge. His urine Legionella antigen and strep pneumo urinary antigen were both negative. His blood pressure improved. He was noted to be mildly anemic. Further workup will be deferred to his primary care physician. The patient received 2 days of IV antibiotics in the hospital. He was discharged home on 5 more days of therapy.  Discharge Exam: Blood pressure 122/81, pulse 80, temperature 98.3 F (36.8 C), temperature source Oral, resp. rate 19, height 5\' 10"  (1.778  m), weight 65.046 kg (143 lb 6.4 oz), SpO2 98.00%.  Lungs: Decreased breath sounds in the bases, otherwise clear. Heart: S1, S2, with no murmurs rubs or gallops. Abdomen: Positive bowel sounds, soft, nontender, nondistended. Extremities: No pedal edema.   Discharge Orders    Future Orders Please Complete By Expires   Diet general      Increase activity slowly      Discharge instructions      Comments:   You have been prescribed oxygen. Wear the oxygen at 2 L per minute close to 24 hours daily.      Follow-up Information    Follow up with Isabella Bowens, MD. Schedule an appointment as soon as possible for a visit in 6 days.   Contact information:   [address] [place] [address] [phone]          Total discharge time: 35 minutes.  Signed: Doshie Maggi 08/06/2011, 12:17 PM

## 2011-08-06 NOTE — Progress Notes (Signed)
Patient given discharge instructions with no questions. Told to make a f/u appointment on Monday with PCP. O2 tank given and [facility] called to send out concentrator. Left with family friend Mardelle Matte in satisfactory condition.

## 2011-08-29 ENCOUNTER — Inpatient Hospital Stay (HOSPITAL_COMMUNITY)
Admission: EM | Admit: 2011-08-29 | Discharge: 2011-09-01 | DRG: 481 | Disposition: A | Discharge status: Skilled Nursing Facility | Payer: Medicare Other | Attending: Internal Medicine | Admitting: Internal Medicine

## 2011-08-29 ENCOUNTER — Emergency Department (HOSPITAL_COMMUNITY): Payer: Medicare Other

## 2011-08-29 ENCOUNTER — Inpatient Hospital Stay (HOSPITAL_COMMUNITY): Payer: Medicare Other

## 2011-08-29 ENCOUNTER — Encounter (HOSPITAL_COMMUNITY): Payer: Self-pay

## 2011-08-29 DIAGNOSIS — J9610 Chronic respiratory failure, unspecified whether with hypoxia or hypercapnia: Secondary | ICD-10-CM | POA: Diagnosis present

## 2011-08-29 DIAGNOSIS — J449 Chronic obstructive pulmonary disease, unspecified: Secondary | ICD-10-CM | POA: Diagnosis present

## 2011-08-29 DIAGNOSIS — D62 Acute posthemorrhagic anemia: Secondary | ICD-10-CM | POA: Diagnosis not present

## 2011-08-29 DIAGNOSIS — S72002A Fracture of unspecified part of neck of left femur, initial encounter for closed fracture: Secondary | ICD-10-CM | POA: Diagnosis present

## 2011-08-29 DIAGNOSIS — J4489 Other specified chronic obstructive pulmonary disease: Secondary | ICD-10-CM | POA: Diagnosis present

## 2011-08-29 DIAGNOSIS — R296 Repeated falls: Secondary | ICD-10-CM | POA: Diagnosis present

## 2011-08-29 DIAGNOSIS — K219 Gastro-esophageal reflux disease without esophagitis: Secondary | ICD-10-CM | POA: Diagnosis present

## 2011-08-29 DIAGNOSIS — I10 Essential (primary) hypertension: Secondary | ICD-10-CM

## 2011-08-29 DIAGNOSIS — S72009A Fracture of unspecified part of neck of unspecified femur, initial encounter for closed fracture: Secondary | ICD-10-CM

## 2011-08-29 DIAGNOSIS — E785 Hyperlipidemia, unspecified: Secondary | ICD-10-CM | POA: Diagnosis present

## 2011-08-29 DIAGNOSIS — Z9981 Dependence on supplemental oxygen: Secondary | ICD-10-CM

## 2011-08-29 DIAGNOSIS — N400 Enlarged prostate without lower urinary tract symptoms: Secondary | ICD-10-CM | POA: Diagnosis present

## 2011-08-29 DIAGNOSIS — Y998 Other external cause status: Secondary | ICD-10-CM

## 2011-08-29 DIAGNOSIS — J438 Other emphysema: Secondary | ICD-10-CM

## 2011-08-29 LAB — URINALYSIS, ROUTINE W REFLEX MICROSCOPIC
Bilirubin Urine: NEGATIVE
Ketones, ur: NEGATIVE mg/dL
Leukocytes, UA: NEGATIVE
Nitrite: NEGATIVE
Protein, ur: NEGATIVE mg/dL
Urobilinogen, UA: 0.2 mg/dL (ref 0.0–1.0)
pH: 7 (ref 5.0–8.0)

## 2011-08-29 LAB — COMPREHENSIVE METABOLIC PANEL
ALT: 19 U/L (ref 0–53)
Alkaline Phosphatase: 85 U/L (ref 39–117)
BUN: 6 mg/dL (ref 6–23)
CO2: 31 mEq/L (ref 19–32)
GFR calc Af Amer: >90 mL/min (ref >90)
GFR calc non Af Amer: >90 mL/min (ref >90)
Glucose, Bld: 117 mg/dL — ABNORMAL HIGH (ref 70–99)
Potassium: 5.1 mEq/L (ref 3.5–5.1)
Sodium: 135 mEq/L (ref 135–145)
Total Protein: 8 g/dL (ref 6.0–8.3)

## 2011-08-29 LAB — DIFFERENTIAL
Eosinophils Absolute: 0 10*3/uL (ref 0.0–0.7)
Eosinophils Relative: 0 % (ref 0–5)
Lymphocytes Relative: 5 % — ABNORMAL LOW (ref 12–46)
Lymphs Abs: 0.8 10*3/uL (ref 0.7–4.0)
Monocytes Relative: 5 % (ref 3–12)
Neutrophils Relative %: 90 % — ABNORMAL HIGH (ref 43–77)

## 2011-08-29 LAB — CBC
Hemoglobin: 14.7 g/dL (ref 13.0–17.0)
MCH: 33 pg (ref 26.0–34.0)
MCV: 96.6 fL (ref 78.0–100.0)
RBC: 4.45 MIL/uL (ref 4.22–5.81)
WBC: 14.8 10*3/uL — ABNORMAL HIGH (ref 4.0–10.5)

## 2011-08-29 LAB — PREPARE RBC (CROSSMATCH)

## 2011-08-29 MED ORDER — SODIUM CHLORIDE 0.45 % IV SOLN
INTRAVENOUS | Status: DC
  Administered 2011-08-29: 17:00:00 via INTRAVENOUS

## 2011-08-29 MED ORDER — ACETAMINOPHEN 650 MG RE SUPP: 650.0000 mg | Freq: Four times a day (QID) | RECTAL | Status: DC | PRN

## 2011-08-29 MED ORDER — MIRTAZAPINE 30 MG PO TABS
15.0000 mg | ORAL_TABLET | Freq: Every day | ORAL | Status: DC
  Administered 2011-08-29 – 2011-08-31 (×3): 15 mg via ORAL
  Filled 2011-08-29 (×3): qty 1

## 2011-08-29 MED ORDER — TAMSULOSIN HCL 0.4 MG PO CAPS
0.4000 mg | ORAL_CAPSULE | Freq: Every day | ORAL | Status: DC
  Administered 2011-08-29 – 2011-09-01 (×4): 0.4 mg via ORAL
  Filled 2011-08-29 (×4): qty 1

## 2011-08-29 MED ORDER — HYDROMORPHONE HCL PF 1 MG/ML IJ SOLN
1.0000 mg | Freq: Once | INTRAMUSCULAR | Status: AC
  Administered 2011-08-29: 1 mg via INTRAVENOUS
  Filled 2011-08-29: qty 1

## 2011-08-29 MED ORDER — CALCIUM CARBONATE 1250 (500 CA) MG PO TABS
1250.0000 mg | ORAL_TABLET | Freq: Every day | ORAL | Status: DC
  Administered 2011-08-29 – 2011-09-01 (×4): 1250 mg via ORAL
  Filled 2011-08-29 (×2): qty 1
  Filled 2011-08-29: qty 3
  Filled 2011-08-29: qty 2
  Filled 2011-08-29: qty 1

## 2011-08-29 MED ORDER — ALBUTEROL SULFATE (5 MG/ML) 0.5% IN NEBU: 2.5000 mg | INHALATION_SOLUTION | Freq: Four times a day (QID) | RESPIRATORY_TRACT | Status: DC | PRN

## 2011-08-29 MED ORDER — ONDANSETRON 4 MG PO TBDP
4.0000 mg | ORAL_TABLET | Freq: Once | ORAL | Status: AC
  Administered 2011-08-29: 4 mg via ORAL
  Filled 2011-08-29: qty 1

## 2011-08-29 MED ORDER — ENOXAPARIN SODIUM 40 MG/0.4ML ~~LOC~~ SOLN
40.0000 mg | SUBCUTANEOUS | Status: DC
  Administered 2011-08-29: 40 mg via SUBCUTANEOUS
  Filled 2011-08-29: qty 0.4

## 2011-08-29 MED ORDER — ACETAMINOPHEN 325 MG PO TABS: 650.0000 mg | ORAL_TABLET | Freq: Four times a day (QID) | ORAL | Status: DC | PRN

## 2011-08-29 MED ORDER — POLYVINYL ALCOHOL 1.4 % OP SOLN: 1.0000 [drp] | Freq: Every day | OPHTHALMIC | Status: DC | PRN

## 2011-08-29 MED ORDER — LISINOPRIL 10 MG PO TABS
10.0000 mg | ORAL_TABLET | Freq: Every day | ORAL | Status: DC
  Administered 2011-08-29 – 2011-09-01 (×4): 10 mg via ORAL
  Filled 2011-08-29 (×4): qty 1

## 2011-08-29 MED ORDER — HYDROMORPHONE HCL PF 1 MG/ML IJ SOLN
1.0000 mg | INTRAMUSCULAR | Status: DC | PRN
  Administered 2011-08-29 – 2011-08-30 (×4): 1 mg via INTRAVENOUS
  Filled 2011-08-29 (×4): qty 1

## 2011-08-29 MED ORDER — SODIUM CHLORIDE 0.9 % IJ SOLN
INTRAMUSCULAR | Status: AC
  Administered 2011-08-29: 10 mL
  Filled 2011-08-29: qty 3

## 2011-08-29 MED ORDER — ONDANSETRON HCL 4 MG/2ML IJ SOLN: 4.0000 mg | Freq: Four times a day (QID) | INTRAMUSCULAR | Status: DC | PRN

## 2011-08-29 MED ORDER — FLUTICASONE-SALMETEROL 250-50 MCG/DOSE IN AEPB
1.0000 | INHALATION_SPRAY | Freq: Two times a day (BID) | RESPIRATORY_TRACT | Status: DC
  Administered 2011-08-29 – 2011-09-01 (×6): 1 via RESPIRATORY_TRACT
  Filled 2011-08-29: qty 14

## 2011-08-29 MED ORDER — ONDANSETRON HCL 4 MG PO TABS: 4.0000 mg | ORAL_TABLET | Freq: Four times a day (QID) | ORAL | Status: DC | PRN

## 2011-08-29 MED ORDER — LACTASE 3000 UNITS PO TABS
1.0000 | ORAL_TABLET | Freq: Every day | ORAL | Status: DC | PRN
Start: 2011-08-29 — End: 2011-09-01

## 2011-08-29 MED ORDER — GUAIFENESIN ER 600 MG PO TB12
1200.0000 mg | ORAL_TABLET | Freq: Two times a day (BID) | ORAL | Status: DC
  Administered 2011-08-29 – 2011-09-01 (×6): 1200 mg via ORAL
  Filled 2011-08-29 (×6): qty 2

## 2011-08-29 MED ORDER — LISINOPRIL-HYDROCHLOROTHIAZIDE 10-12.5 MG PO TABS: 1.0000 | ORAL_TABLET | Freq: Every day | ORAL | Status: DC

## 2011-08-29 MED ORDER — OXYCODONE-ACETAMINOPHEN 5-325 MG PO TABS
1.0000 | ORAL_TABLET | Freq: Once | ORAL | Status: AC
  Administered 2011-08-29: 1 via ORAL
  Filled 2011-08-29: qty 1

## 2011-08-29 MED ORDER — ATORVASTATIN CALCIUM 10 MG PO TABS
10.0000 mg | ORAL_TABLET | Freq: Every day | ORAL | Status: DC
  Administered 2011-08-29 – 2011-09-01 (×4): 10 mg via ORAL
  Filled 2011-08-29 (×4): qty 1

## 2011-08-29 MED ORDER — HYDROCHLOROTHIAZIDE 12.5 MG PO CAPS
12.5000 mg | ORAL_CAPSULE | Freq: Every day | ORAL | Status: DC
  Administered 2011-08-29 – 2011-09-01 (×4): 12.5 mg via ORAL
  Filled 2011-08-29 (×4): qty 1

## 2011-08-29 MED ORDER — LEVALBUTEROL TARTRATE 45 MCG/ACT IN AERO
1.0000 | INHALATION_SPRAY | Freq: Three times a day (TID) | RESPIRATORY_TRACT | Status: DC
  Administered 2011-08-29 – 2011-09-01 (×7): 1 via RESPIRATORY_TRACT
  Filled 2011-08-29: qty 15

## 2011-08-29 NOTE — H&P (Signed)
PCP:   No primary provider on file.   Chief Complaint:  Hip pain  HPI: This is an 78 y/o gentleman who was recently discharged from the hospital approximately a month ago after being treated for a pneumonia.  He has a history of copd and was felt to probably have some chronic respiratory failure, and he was sent home on oxygen.  He reports doing fairly well since his discharge and feels that his breathing is getting better. He apparently got up to go to the bathroom last night, and when he was going to sit on the toilet seat, his knee gave out causing him to fall.  He was able to go back to bed, but when he woke up at 4am to go to the bathroom again, he described severe pain in his left hip.  He is not clear on the details of the fall, but denies any loss of consciousness, head trauma, chest pain or shortness of breath.  He had no dizziness.  He was brought to the emergency room for evaluation and was found to have a left femoral neck fracture.  Allergies:   Allergies  Allergen Reactions  . Milk-Related Compounds Other (See Comments)    Gi UPSET "SOMETIMES"  . Aspirin Itching, Swelling, Rash and Other (See Comments)    GI upset  . Penicillins Rash      Past Medical History  Diagnosis Date  . Hypertension   . Hypercholesterolemia   . GERD (gastroesophageal reflux disease)   . COPD (chronic obstructive pulmonary disease)   . Chronic insomnia   . BPH (benign prostatic hyperplasia)   . Anemia 08/06/2011    Past Surgical History  Procedure Date  . Colonoscopy 03/31/10    Dr. Darrick Penna  . Right knee     x3  . Nose surgery     Prior to Admission medications   Medication Sig Start Date End Date Taking? Authorizing Provider  acetaminophen (TYLENOL) 500 MG tablet Take 500 mg by mouth every 6 (six) hours as needed. Pain    Yes Historical Provider, MD  albuterol (PROVENTIL) (2.5 MG/3ML) 0.083% nebulizer solution Take 2.5 mg by nebulization every 6 (six) hours as needed. For shortness  Of  breath   Yes Historical Provider, MD  atorvastatin (LIPITOR) 10 MG tablet Take 10 mg by mouth daily.     Yes Historical Provider, MD  calcium carbonate (OS-CAL) 600 MG TABS Take 600 mg by mouth daily.     Yes Historical Provider, MD  Fluticasone-Salmeterol (ADVAIR DISKUS) 250-50 MCG/DOSE AEPB Inhale 1 puff into the lungs every 12 (twelve) hours.    Yes Historical Provider, MD  guaiFENesin (MUCINEX) 600 MG 12 hr tablet Take 600 mg by mouth daily. 08/06/11 08/05/12 Yes Elliot Cousin, MD  hydroxypropyl methylcellulose (ISOPTO TEARS) 2.5 % ophthalmic solution Place 1 drop into both eyes daily as needed. Dry Eyes    Yes Historical Provider, MD  lactase (LACTAID) 3000 UNITS tablet Take 1 tablet by mouth daily as needed. Lactose Intolerance   Yes Historical Provider, MD  levalbuterol (XOPENEX HFA) 45 MCG/ACT inhaler Inhale 1 puff into the lungs 3 (three) times daily. 08/06/11 08/05/12 Yes Elliot Cousin, MD  lisinopril-hydrochlorothiazide (PRINZIDE,ZESTORETIC) 10-12.5 MG per tablet Take 1 tablet by mouth daily.     Yes Historical Provider, MD  mirtazapine (REMERON) 15 MG tablet Take 15 mg by mouth at bedtime.   Yes Historical Provider, MD  Tamsulosin HCl (FLOMAX) 0.4 MG CAPS Take 0.4 mg by mouth daily.  Yes Historical Provider, MD    Social History:  reports that he has quit smoking. His smoking use included Cigarettes. He has a 13.75 pack-year smoking history. He does not have any smokeless tobacco history on file. He reports that he drinks about 1.2 ounces of alcohol per week. He reports that he does not use illicit drugs.  History reviewed. No pertinent family history.  Review of Systems: Positives in bold Constitutional: Denies fever, chills, diaphoresis, appetite change and fatigue.  HEENT: Denies photophobia, eye pain, redness, hearing loss, ear pain, congestion, sore throat, rhinorrhea, sneezing, mouth sores, trouble swallowing, neck pain, neck stiffness and tinnitus.   Respiratory: Denies SOB,  DOE, cough, chest tightness,  and wheezing.   Cardiovascular: Denies chest pain, palpitations and leg swelling.  Gastrointestinal: Denies nausea, vomiting, abdominal pain, diarrhea, constipation, blood in stool and abdominal distention.  Genitourinary: Denies dysuria, urgency, frequency, hematuria, flank pain and difficulty urinating.  Musculoskeletal: Denies myalgias, back pain, joint swelling, arthralgias and gait problem.  Skin: Denies pallor, rash and wound.  Neurological: Denies dizziness, seizures, syncope, weakness, light-headedness, numbness and headaches.  Hematological: Denies adenopathy. Easy bruising, personal or family bleeding history  Psychiatric/Behavioral: Denies suicidal ideation, mood changes, confusion, nervousness, sleep disturbance and agitation   Physical Exam: Blood pressure 157/104, pulse 92, temperature 98.1 F (36.7 C), temperature source Oral, resp. rate 16, height 5\' 8"  (1.727 m), weight 65 kg (143 lb 4.8 oz), SpO2 94.00%. Gen: NAD, AAOx3 HEENT: [place], AT Neck: supple Chest: CTA B Cardiac: S1, S2, RRR Abd: soft, NT, BS+ Ext: no edema, cyanosis, +pain in left hip Neuro: grossly intact, non focal Skin: intact, no visible rashes  Labs on Admission:  Results for orders placed during the hospital encounter of 08/29/11 (from the past 48 hour(s))  URINALYSIS, ROUTINE W REFLEX MICROSCOPIC     Status: Normal   Collection Time   08/29/11 12:22 PM      Component Value Range Comment   Color, Urine YELLOW  YELLOW     APPearance CLEAR  CLEAR     Specific Gravity, Urine 1.010  1.005 - 1.030     pH 7.0  5.0 - 8.0     Glucose, UA NEGATIVE  NEGATIVE (mg/dL)    Hgb urine dipstick NEGATIVE  NEGATIVE     Bilirubin Urine NEGATIVE  NEGATIVE     Ketones, ur NEGATIVE  NEGATIVE (mg/dL)    Protein, ur NEGATIVE  NEGATIVE (mg/dL)    Urobilinogen, UA 0.2  0.0 - 1.0 (mg/dL)    Nitrite NEGATIVE  NEGATIVE     Leukocytes, UA NEGATIVE  NEGATIVE  MICROSCOPIC NOT DONE ON URINES WITH  NEGATIVE PROTEIN, BLOOD, LEUKOCYTES, NITRITE, OR GLUCOSE <1000 mg/dL.  CBC     Status: Abnormal   Collection Time   08/29/11 12:57 PM      Component Value Range Comment   WBC 14.8 (*) 4.0 - 10.5 (K/uL)    RBC 4.45  4.22 - 5.81 (MIL/uL)    Hemoglobin 14.7  13.0 - 17.0 (g/dL)    HCT 16.1  09.6 - 04.5 (%)    MCV 96.6  78.0 - 100.0 (fL)    MCH 33.0  26.0 - 34.0 (pg)    MCHC 34.2  30.0 - 36.0 (g/dL)    RDW 40.9  81.1 - 91.4 (%)    Platelets 214  150 - 400 (K/uL)   DIFFERENTIAL     Status: Abnormal   Collection Time   08/29/11 12:57 PM      Component  Value Range Comment   Neutrophils Relative 90 (*) 43 - 77 (%)    Neutro Abs 13.3 (*) 1.7 - 7.7 (K/uL)    Lymphocytes Relative 5 (*) 12 - 46 (%)    Lymphs Abs 0.8  0.7 - 4.0 (K/uL)    Monocytes Relative 5  3 - 12 (%)    Monocytes Absolute 0.7  0.1 - 1.0 (K/uL)    Eosinophils Relative 0  0 - 5 (%)    Eosinophils Absolute 0.0  0.0 - 0.7 (K/uL)    Basophils Relative 0  0 - 1 (%)    Basophils Absolute 0.0  0.0 - 0.1 (K/uL)   COMPREHENSIVE METABOLIC PANEL     Status: Abnormal   Collection Time   08/29/11 12:57 PM      Component Value Range Comment   Sodium 135  135 - 145 (mEq/L)    Potassium 5.1  3.5 - 5.1 (mEq/L)    Chloride 95 (*) 96 - 112 (mEq/L)    CO2 31  19 - 32 (mEq/L)    Glucose, Bld 117 (*) 70 - 99 (mg/dL)    BUN 6  6 - 23 (mg/dL)    Creatinine, Ser 1.47  0.50 - 1.35 (mg/dL)    Calcium 82.9  8.4 - 10.5 (mg/dL)    Total Protein 8.0  6.0 - 8.3 (g/dL)    Albumin 3.8  3.5 - 5.2 (g/dL)    AST 28  0 - 37 (U/L)    ALT 19  0 - 53 (U/L)    Alkaline Phosphatase 85  39 - 117 (U/L)    Total Bilirubin 0.7  0.3 - 1.2 (mg/dL)    GFR calc non Af Amer >90  >90 (mL/min)    GFR calc Af Amer >90  >90 (mL/min)     Radiological Exams on Admission: Dg Hip Complete Left  08/29/2011  *RADIOLOGY REPORT*  Clinical Data: Chronic hip pain  LEFT HIP 2 VIEWS  Comparison: None  Findings: Bones appear demineralized. Minimal narrowing of left hip joint.  Pelvis appears intact. Probable impacted subcapital fracture left femoral neck. No dislocation. Degenerative disc disease changes lower lumbar spine. Scattered atherosclerotic calcifications.  IMPRESSION: Probable impacted subcapital fracture left femoral neck. Osseous demineralization. Per CMS PQRS reporting requirements (PQRS Measure 24): Given the patient's age of greater than 50 and the fracture site (hip, distal radius, or spine), the patient should be tested for osteoporosis using DXA, and the appropriate treatment considered based on the DXA results.  Original Report Authenticated By: Lollie Marrow, M.D.   Dg Chest Portable 1 View  08/29/2011  *RADIOLOGY REPORT*  Clinical Data: Hip pain, hypertension, COPD, recent pneumonia  PORTABLE CHEST - 1 VIEW  Comparison: Portable exam 1238 hours repeated at 1245 hours compared to 08/03/2011  Findings: Mild rotation to the right. Normal heart size and pulmonary vascularity. Atherosclerotic calcification of mildly tortuous thoracic aorta. Emphysematous changes with right basilar effusion and atelectasis versus infiltrate. Suspected infiltrate medial left lung base. No pneumothorax. Bones diffusely demineralized.  IMPRESSION: Emphysematous changes with right basilar atelectasis versus infiltrate and associated right pleural effusion. Suspected infiltrate medial left lung base.  Original Report Authenticated By: Lollie Marrow, M.D.    Assessment/Plan Principal Problem:  *Hip fracture requiring operative repair, left, closed, initial encounter Active Problems:  HTN (hypertension)  Hyperlipidemia  COPD (chronic obstructive pulmonary disease)  GERD (gastroesophageal reflux disease)  Chronic respiratory failure  1. Hip fracture.  Orthopedics has been consulted.  The family has requested  Dr. Romeo Apple. Will provide pain control.  Will need physical therapy post operatively  2. COPD.  Appears stable at this time.  Will continue with supplemental oxygen.   Continue pulmonary hygiene with mucolytics, incentive spirometry.  Continue outpatient meds, bronchodilators.  Clinically, I do not see any evidence of recurrence of pneumonia.  In fact, patient feels improved. Will hold off on restarting antibiotics.  3. HTN.  Uncontrolled due to pain.  Continue home meds and pain meds  4. DVT prophylaxis with lovenox  5. Pre-op eval.  Patient has no cardiac history or cerebrovascular disease.  He has not had any symptoms of chest pain.  Will check EKG, but he would be considered to have a low cardiac risk and should proceed with a necessary surgery.   Time Spent on Admission:  Peggi Yono [facility] Pager: [phone] 08/29/2011, 4:17 PM

## 2011-08-29 NOTE — ED Provider Notes (Signed)
History     CSN: [id number]  Arrival date & time 08/29/11  7829   None     Chief Complaint  Patient presents with  . Hip Pain    (Consider location/radiation/quality/duration/timing/severity/associated sxs/prior treatment) HPI Comments: Pt presents with pain of the left hip. He denies fall. EMS report pt was attempting to get up this AM and could not move the left hip or apply weight due to pain. No reported fall or injury. No previous hip surgery. It is of note that the patient was discharged at the end of April for community acquired pneumonia.   The history is provided by the EMS personnel.    Past Medical History  Diagnosis Date  . Hypertension   . Hypercholesterolemia   . GERD (gastroesophageal reflux disease)   . COPD (chronic obstructive pulmonary disease)   . Chronic insomnia   . BPH (benign prostatic hyperplasia)   . Anemia 08/06/2011    Past Surgical History  Procedure Date  . Colonoscopy 03/31/10    Dr. Darrick Penna  . Right knee     x3  . Nose surgery     No family history on file.  History  Substance Use Topics  . Smoking status: Former Smoker -- 0.2 packs/day for 55 years    Types: Cigarettes  . Smokeless tobacco: Not on file  . Alcohol Use: 1.2 oz/week    2 Shots of liquor per week      Review of Systems  Constitutional: Negative for activity change.       All ROS Neg except as noted in HPI  HENT: Negative for nosebleeds and neck pain.   Eyes: Negative for photophobia and discharge.  Respiratory: Positive for cough. Negative for shortness of breath and wheezing.   Cardiovascular: Negative for chest pain and palpitations.  Gastrointestinal: Positive for abdominal pain. Negative for blood in stool.  Genitourinary: Negative for dysuria, frequency and hematuria.  Musculoskeletal: Positive for arthralgias. Negative for back pain.  Skin: Negative.   Neurological: Negative for dizziness, seizures and speech difficulty.  Psychiatric/Behavioral:  Positive for sleep disturbance. Negative for hallucinations and confusion.    Allergies  Milk-related compounds; Aspirin; and Penicillins  Home Medications   Current Outpatient Rx  Name Route Sig Dispense Refill  . ACETAMINOPHEN 500 MG PO TABS Oral Take 500 mg by mouth every 6 (six) hours as needed. Pain     . ALBUTEROL SULFATE (2.5 MG/3ML) 0.083% IN NEBU Nebulization Take 2.5 mg by nebulization every 6 (six) hours as needed. For shortness  Of breath    . ATORVASTATIN CALCIUM 10 MG PO TABS Oral Take 10 mg by mouth daily.      Marland Kitchen CALCIUM CARBONATE 600 MG PO TABS Oral Take 600 mg by mouth daily.      Marland Kitchen FLUTICASONE-SALMETEROL 250-50 MCG/DOSE IN AEPB Inhalation Inhale 1 puff into the lungs every 12 (twelve) hours. AS DIRECTED     . GUAIFENESIN ER 600 MG PO TB12 Oral Take 2 tablets (1,200 mg total) by mouth 2 (two) times daily as needed for congestion.    Marland Kitchen HYPROMELLOSE 2.5 % OP SOLN Both Eyes Place 1 drop into both eyes daily as needed. Dry Eyes     . LEVALBUTEROL TARTRATE 45 MCG/ACT IN AERO Inhalation Inhale 1 puff into the lungs 3 (three) times daily. 1 Inhaler   . LISINOPRIL-HYDROCHLOROTHIAZIDE 10-12.5 MG PO TABS Oral Take 1 tablet by mouth daily.      Marland Kitchen LORAZEPAM 0.5 MG PO TABS Oral Take 0.5 mg  by mouth 2 (two) times daily. Anxiety     . QUETIAPINE FUMARATE 25 MG PO TABS Oral Take 25 mg by mouth at bedtime.      . TAMSULOSIN HCL 0.4 MG PO CAPS Oral Take 0.4 mg by mouth at bedtime. **Does not take every night**      BP 175/108  Pulse 96  Temp(Src) 98 F (36.7 C) (Oral)  Resp 16  Wt 145 lb (65.772 kg)  SpO2 94%  Physical Exam  Nursing note and vitals reviewed. Constitutional: He is oriented to person, place, and time. He appears well-developed and well-nourished.  Non-toxic appearance.  HENT:  Head: Normocephalic.  Right Ear: Tympanic membrane and external ear normal.  Left Ear: Tympanic membrane and external ear normal.  Eyes: EOM and lids are normal. Pupils are equal, round, and  reactive to light.  Neck: Normal range of motion. Neck supple. Carotid bruit is not present.  Cardiovascular: Normal rate, regular rhythm, normal heart sounds, intact distal pulses and normal pulses.   Pulmonary/Chest: No respiratory distress.       Course breath sounds. No wheezes. No increase respiratory effort or retractions.  Abdominal: Soft. Bowel sounds are normal. There is no tenderness. There is no guarding.  Musculoskeletal: Normal range of motion.       Pain to palpation or attempted ROM of the the left hip. Distal pulses symetrical  Lymphadenopathy:       Head (right side): No submandibular adenopathy present.       Head (left side): No submandibular adenopathy present.    He has no cervical adenopathy.  Neurological: He is alert and oriented to person, place, and time. He has normal strength. No cranial nerve deficit or sensory deficit.  Skin: Skin is warm and dry.  Psychiatric: He has a normal mood and affect. His speech is normal.    ED Course  Procedures (including critical care time)  Labs Reviewed - No data to display No results found.   No diagnosis found.    MDM  I have reviewed nursing notes, vital signs, and all appropriate lab and imaging results for this patient. Test result reviewed with patient and family. Family request Dr Romeo Apple to see pt for fracture care. Dr Romeo Apple called. He request Hospitalist admit pt or pt be seen by Dr Hilda Lias as he is not on call for today. Case discussed with Dr Kerry Hough. He will see pt for admission.     Kathie Dike, [place] 08/29/11 [phone]

## 2011-08-29 NOTE — ED Notes (Signed)
Pt told EMS attempted to get up and go to br this am and could not bc of pain in left hip. Does not report at fall. Pt reports pain with movement of leg. No shortening or rotation present to leg. Nod to left hip either.

## 2011-08-30 ENCOUNTER — Encounter (HOSPITAL_COMMUNITY): Payer: Self-pay | Admitting: Anesthesiology

## 2011-08-30 ENCOUNTER — Inpatient Hospital Stay (HOSPITAL_COMMUNITY): Payer: Medicare Other | Admitting: Anesthesiology

## 2011-08-30 ENCOUNTER — Encounter (HOSPITAL_COMMUNITY): Payer: Self-pay | Admitting: Orthopedic Surgery

## 2011-08-30 ENCOUNTER — Inpatient Hospital Stay (HOSPITAL_COMMUNITY): Payer: Medicare Other

## 2011-08-30 ENCOUNTER — Encounter (HOSPITAL_COMMUNITY)
Admission: EM | Disposition: A | Discharge status: Skilled Nursing Facility | Payer: Self-pay | Source: Home / Self Care | Attending: Internal Medicine

## 2011-08-30 DIAGNOSIS — S72033B Displaced midcervical fracture of unspecified femur, initial encounter for open fracture type I or II: Secondary | ICD-10-CM

## 2011-08-30 DIAGNOSIS — S72009A Fracture of unspecified part of neck of unspecified femur, initial encounter for closed fracture: Secondary | ICD-10-CM

## 2011-08-30 DIAGNOSIS — J9610 Chronic respiratory failure, unspecified whether with hypoxia or hypercapnia: Secondary | ICD-10-CM

## 2011-08-30 DIAGNOSIS — I10 Essential (primary) hypertension: Secondary | ICD-10-CM

## 2011-08-30 DIAGNOSIS — J438 Other emphysema: Secondary | ICD-10-CM

## 2011-08-30 HISTORY — PX: HIP PINNING,CANNULATED: SHX1758

## 2011-08-30 LAB — CBC
MCV: 98.7 fL (ref 78.0–100.0)
Platelets: 188 10*3/uL (ref 150–400)
RBC: 3.87 MIL/uL — ABNORMAL LOW (ref 4.22–5.81)
RDW: 12.9 % (ref 11.5–15.5)
WBC: 10.2 10*3/uL (ref 4.0–10.5)

## 2011-08-30 LAB — BASIC METABOLIC PANEL WITH GFR
BUN: 7 mg/dL (ref 6–23)
CO2: 31 meq/L (ref 19–32)
Calcium: 9.1 mg/dL (ref 8.4–10.5)
Chloride: 93 meq/L — ABNORMAL LOW (ref 96–112)
Creatinine, Ser: 0.78 mg/dL (ref 0.50–1.35)
GFR calc Af Amer: >90 mL/min (ref >90)
GFR calc non Af Amer: 82 mL/min — ABNORMAL LOW (ref >90)
Glucose, Bld: 101 mg/dL — ABNORMAL HIGH (ref 70–99)
Potassium: 4.2 meq/L (ref 3.5–5.1)
Sodium: 132 meq/L — ABNORMAL LOW (ref 135–145)

## 2011-08-30 LAB — SURGICAL PCR SCREEN
MRSA, PCR: NEGATIVE
Staphylococcus aureus: NEGATIVE

## 2011-08-30 SURGERY — FIXATION, FEMUR, NECK, PERCUTANEOUS, USING SCREW
Anesthesia: Spinal | Site: Hip | Laterality: Left | Wound class: Clean

## 2011-08-30 MED ORDER — MIDAZOLAM HCL 2 MG/2ML IJ SOLN
INTRAMUSCULAR | Status: AC
  Filled 2011-08-30: qty 2

## 2011-08-30 MED ORDER — FLEET ENEMA 7-19 GM/118ML RE ENEM: 1.0000 | ENEMA | Freq: Once | RECTAL | Status: AC | PRN

## 2011-08-30 MED ORDER — PHENYLEPHRINE HCL 10 MG/ML IJ SOLN
INTRAMUSCULAR | Status: DC | PRN
  Administered 2011-08-30 (×2): 50 ug via INTRAVENOUS

## 2011-08-30 MED ORDER — FENTANYL CITRATE 0.05 MG/ML IJ SOLN
INTRAMUSCULAR | Status: DC | PRN
  Administered 2011-08-30: 25 ug via INTRAVENOUS

## 2011-08-30 MED ORDER — VANCOMYCIN HCL 1000 MG IV SOLR
1000.0000 mg | INTRAVENOUS | Status: DC | PRN
  Administered 2011-08-30: 1000 mg via INTRAVENOUS

## 2011-08-30 MED ORDER — FENTANYL CITRATE 0.05 MG/ML IJ SOLN
25.0000 ug | INTRAMUSCULAR | Status: DC | PRN
Start: 2011-08-30 — End: 2011-08-30

## 2011-08-30 MED ORDER — TRAMADOL HCL 50 MG PO TABS
50.0000 mg | ORAL_TABLET | Freq: Four times a day (QID) | ORAL | Status: DC
  Administered 2011-08-30 – 2011-09-01 (×8): 50 mg via ORAL
  Filled 2011-08-30 (×8): qty 1

## 2011-08-30 MED ORDER — VANCOMYCIN HCL IN DEXTROSE 1-5 GM/200ML-% IV SOLN
INTRAVENOUS | Status: AC
  Filled 2011-08-30: qty 200

## 2011-08-30 MED ORDER — PHENYLEPHRINE HCL 10 MG/ML IJ SOLN
INTRAMUSCULAR | Status: AC
  Filled 2011-08-30: qty 1

## 2011-08-30 MED ORDER — FENTANYL CITRATE 0.05 MG/ML IJ SOLN
INTRAMUSCULAR | Status: AC
  Filled 2011-08-30: qty 2

## 2011-08-30 MED ORDER — BUPIVACAINE IN DEXTROSE 0.75-8.25 % IT SOLN
INTRATHECAL | Status: AC
  Filled 2011-08-30: qty 2

## 2011-08-30 MED ORDER — PHENOL 1.4 % MT LIQD: 1.0000 | OROMUCOSAL | Status: DC | PRN

## 2011-08-30 MED ORDER — ACETAMINOPHEN 10 MG/ML IV SOLN
1000.0000 mg | Freq: Four times a day (QID) | INTRAVENOUS | Status: AC
  Administered 2011-08-30 – 2011-08-31 (×3): 1000 mg via INTRAVENOUS
  Filled 2011-08-30 (×3): qty 100

## 2011-08-30 MED ORDER — MIDAZOLAM HCL 2 MG/2ML IJ SOLN
1.0000 mg | INTRAMUSCULAR | Status: AC | PRN
  Administered 2011-08-30 (×3): 2 mg via INTRAVENOUS

## 2011-08-30 MED ORDER — ALUM & MAG HYDROXIDE-SIMETH 200-200-20 MG/5ML PO SUSP: 30.0000 mL | ORAL | Status: DC | PRN

## 2011-08-30 MED ORDER — HYDROMORPHONE HCL PF 1 MG/ML IJ SOLN
0.5000 mg | INTRAMUSCULAR | Status: DC | PRN
  Administered 2011-08-30 – 2011-08-31 (×2): 0.5 mg via INTRAVENOUS
  Filled 2011-08-30 (×2): qty 1

## 2011-08-30 MED ORDER — VANCOMYCIN HCL IN DEXTROSE 1-5 GM/200ML-% IV SOLN: 1000.0000 mg | Freq: Two times a day (BID) | INTRAVENOUS | Status: AC

## 2011-08-30 MED ORDER — ONDANSETRON HCL 4 MG/2ML IJ SOLN
INTRAMUSCULAR | Status: AC
  Filled 2011-08-30: qty 2

## 2011-08-30 MED ORDER — ONDANSETRON HCL 4 MG/2ML IJ SOLN: 4.0000 mg | Freq: Four times a day (QID) | INTRAMUSCULAR | Status: DC | PRN

## 2011-08-30 MED ORDER — LACTATED RINGERS IV SOLN
INTRAVENOUS | Status: DC
  Administered 2011-08-30 (×2): via INTRAVENOUS

## 2011-08-30 MED ORDER — TRAMADOL HCL 50 MG PO TABS
ORAL_TABLET | ORAL | Status: AC
  Administered 2011-08-30: 50 mg via ORAL
  Filled 2011-08-30: qty 1

## 2011-08-30 MED ORDER — VANCOMYCIN HCL IN DEXTROSE 1-5 GM/200ML-% IV SOLN: 1000.0000 mg | INTRAVENOUS | Status: DC

## 2011-08-30 MED ORDER — SODIUM CHLORIDE 0.9 % IR SOLN
Status: DC | PRN
  Administered 2011-08-30: 1

## 2011-08-30 MED ORDER — PROPOFOL 10 MG/ML IV EMUL
INTRAVENOUS | Status: AC
  Filled 2011-08-30: qty 20

## 2011-08-30 MED ORDER — BUPIVACAINE-EPINEPHRINE PF 0.5-1:200000 % IJ SOLN
INTRAMUSCULAR | Status: AC
  Filled 2011-08-30: qty 20

## 2011-08-30 MED ORDER — MIDAZOLAM HCL 5 MG/5ML IJ SOLN
INTRAMUSCULAR | Status: DC | PRN
  Administered 2011-08-30: 2 mg via INTRAVENOUS

## 2011-08-30 MED ORDER — ONDANSETRON HCL 4 MG/2ML IJ SOLN
4.0000 mg | Freq: Once | INTRAMUSCULAR | Status: DC | PRN
Start: 2011-08-30 — End: 2011-08-30

## 2011-08-30 MED ORDER — EPHEDRINE SULFATE 50 MG/ML IJ SOLN
INTRAMUSCULAR | Status: DC | PRN
  Administered 2011-08-30: 15 mg via INTRAVENOUS
  Administered 2011-08-30 (×3): 10 mg via INTRAVENOUS
  Administered 2011-08-30: 5 mg via INTRAVENOUS

## 2011-08-30 MED ORDER — PROPOFOL 10 MG/ML IV EMUL
INTRAVENOUS | Status: DC | PRN
  Administered 2011-08-30: 25 ug/kg/min via INTRAVENOUS

## 2011-08-30 MED ORDER — SODIUM CHLORIDE 0.9 % IV SOLN
INTRAVENOUS | Status: DC
  Administered 2011-08-30: 17:00:00 via INTRAVENOUS

## 2011-08-30 MED ORDER — METHOCARBAMOL 100 MG/ML IJ SOLN
500.0000 mg | Freq: Once | INTRAVENOUS | Status: DC
  Administered 2011-08-30: 500 mg via INTRAVENOUS
  Filled 2011-08-30: qty 5

## 2011-08-30 MED ORDER — SENNOSIDES-DOCUSATE SODIUM 8.6-50 MG PO TABS: 1.0000 | ORAL_TABLET | Freq: Every evening | ORAL | Status: DC | PRN

## 2011-08-30 MED ORDER — ACETAMINOPHEN 650 MG RE SUPP: 650.0000 mg | Freq: Four times a day (QID) | RECTAL | Status: DC | PRN

## 2011-08-30 MED ORDER — CHLORHEXIDINE GLUCONATE 4 % EX LIQD
60.0000 mL | Freq: Once | CUTANEOUS | Status: DC
  Filled 2011-08-30: qty 60

## 2011-08-30 MED ORDER — MENTHOL 3 MG MT LOZG: 1.0000 | LOZENGE | OROMUCOSAL | Status: DC | PRN

## 2011-08-30 MED ORDER — METOCLOPRAMIDE HCL 10 MG PO TABS: 5.0000 mg | ORAL_TABLET | Freq: Three times a day (TID) | ORAL | Status: DC | PRN

## 2011-08-30 MED ORDER — BUPIVACAINE IN DEXTROSE 0.75-8.25 % IT SOLN
INTRATHECAL | Status: DC | PRN
  Administered 2011-08-30: 12 mg via INTRATHECAL

## 2011-08-30 MED ORDER — SODIUM CHLORIDE 0.9 % IJ SOLN
INTRAMUSCULAR | Status: AC
  Administered 2011-08-30: 3 mL
  Filled 2011-08-30: qty 3

## 2011-08-30 MED ORDER — DOCUSATE SODIUM 100 MG PO CAPS
100.0000 mg | ORAL_CAPSULE | Freq: Two times a day (BID) | ORAL | Status: DC
  Administered 2011-08-30 – 2011-09-01 (×4): 100 mg via ORAL
  Filled 2011-08-30 (×4): qty 1

## 2011-08-30 MED ORDER — ONDANSETRON HCL 4 MG/2ML IJ SOLN
4.0000 mg | Freq: Once | INTRAMUSCULAR | Status: AC
  Administered 2011-08-30: 4 mg via INTRAVENOUS

## 2011-08-30 MED ORDER — BUPIVACAINE-EPINEPHRINE PF 0.5-1:200000 % IJ SOLN
INTRAMUSCULAR | Status: DC | PRN
  Administered 2011-08-30: 60 mL

## 2011-08-30 MED ORDER — ENOXAPARIN SODIUM 30 MG/0.3ML ~~LOC~~ SOLN
30.0000 mg | SUBCUTANEOUS | Status: DC
  Administered 2011-08-31 – 2011-09-01 (×2): 30 mg via SUBCUTANEOUS
  Filled 2011-08-30 (×2): qty 0.3

## 2011-08-30 MED ORDER — EPHEDRINE SULFATE 50 MG/ML IJ SOLN
INTRAMUSCULAR | Status: AC
  Filled 2011-08-30: qty 1

## 2011-08-30 MED ORDER — ONDANSETRON HCL 4 MG/2ML IJ SOLN
INTRAMUSCULAR | Status: AC
  Administered 2011-08-30: 4 mg via INTRAVENOUS
  Filled 2011-08-30: qty 2

## 2011-08-30 MED ORDER — DEXTROSE 5 % IV SOLN
500.0000 mg | Freq: Four times a day (QID) | INTRAVENOUS | Status: DC
  Administered 2011-08-30 – 2011-09-01 (×7): 500 mg via INTRAVENOUS
  Filled 2011-08-30 (×11): qty 5

## 2011-08-30 MED ORDER — ACETAMINOPHEN 10 MG/ML IV SOLN
INTRAVENOUS | Status: AC
  Administered 2011-08-30: 1000 mg via INTRAVENOUS
  Filled 2011-08-30: qty 100

## 2011-08-30 MED ORDER — ACETAMINOPHEN 325 MG PO TABS: 650.0000 mg | ORAL_TABLET | Freq: Four times a day (QID) | ORAL | Status: DC | PRN

## 2011-08-30 MED ORDER — MIDAZOLAM HCL 2 MG/2ML IJ SOLN
INTRAMUSCULAR | Status: AC
  Administered 2011-08-30: 2 mg via INTRAVENOUS
  Filled 2011-08-30: qty 2

## 2011-08-30 MED ORDER — FENTANYL CITRATE 0.05 MG/ML IJ SOLN
INTRAMUSCULAR | Status: DC | PRN
  Administered 2011-08-30: 25 ug via INTRATHECAL

## 2011-08-30 MED ORDER — TRAMADOL HCL 50 MG PO TABS
50.0000 mg | ORAL_TABLET | Freq: Once | ORAL | Status: AC
  Administered 2011-08-30: 50 mg via ORAL

## 2011-08-30 MED ORDER — ACETAMINOPHEN 10 MG/ML IV SOLN
1000.0000 mg | Freq: Once | INTRAVENOUS | Status: AC
  Administered 2011-08-30: 1000 mg via INTRAVENOUS

## 2011-08-30 MED ORDER — BISACODYL 10 MG RE SUPP: 10.0000 mg | Freq: Every day | RECTAL | Status: DC | PRN

## 2011-08-30 SURGICAL SUPPLY — 52 items
BAG HAMPER (MISCELLANEOUS) ×2 IMPLANT
BLADE HEX COATED 2.75 (ELECTRODE) ×2 IMPLANT
BLADE SURG SZ10 CARB STEEL (BLADE) ×4 IMPLANT
CHLORAPREP W/TINT 26ML (MISCELLANEOUS) ×2 IMPLANT
CLOTH BEACON ORANGE TIMEOUT ST (SAFETY) ×2 IMPLANT
COVER LIGHT HANDLE STERIS (MISCELLANEOUS) ×4 IMPLANT
COVER MAYO STAND XLG (DRAPE) IMPLANT
DRAPE STERI IOBAN 125X83 (DRAPES) ×2 IMPLANT
DRESSING ALLEVYN BORDER 5X5 (GAUZE/BANDAGES/DRESSINGS) ×2 IMPLANT
DRSG MEPILEX BORDER 4X12 (GAUZE/BANDAGES/DRESSINGS) ×2 IMPLANT
DRSG TEGADERM 4X4.75 (GAUZE/BANDAGES/DRESSINGS) IMPLANT
GAUZE KERLIX 2  STERILE LF (GAUZE/BANDAGES/DRESSINGS) ×2 IMPLANT
GLOVE BIOGEL PI IND STRL 7.0 (GLOVE) ×1 IMPLANT
GLOVE BIOGEL PI IND STRL 7.5 (GLOVE) ×1 IMPLANT
GLOVE BIOGEL PI INDICATOR 7.0 (GLOVE) ×1
GLOVE BIOGEL PI INDICATOR 7.5 (GLOVE) ×1
GLOVE ECLIPSE 6.5 STRL STRAW (GLOVE) ×2 IMPLANT
GLOVE ECLIPSE 7.5 STRL STRAW (GLOVE) ×2 IMPLANT
GLOVE SKINSENSE NS SZ8.0 LF (GLOVE) ×1
GLOVE SKINSENSE STRL SZ8.0 LF (GLOVE) ×1 IMPLANT
GLOVE SS N UNI LF 8.5 STRL (GLOVE) ×2 IMPLANT
GOWN STRL REIN XL XLG (GOWN DISPOSABLE) ×8 IMPLANT
INST SET MAJOR BONE (KITS) ×2 IMPLANT
KIT BLADEGUARD II DBL (SET/KITS/TRAYS/PACK) ×2 IMPLANT
KIT ROOM TURNOVER APOR (KITS) ×2 IMPLANT
MANIFOLD NEPTUNE II (INSTRUMENTS) ×2 IMPLANT
MARKER SKIN DUAL TIP RULER LAB (MISCELLANEOUS) ×2 IMPLANT
NEEDLE HYPO 21X1.5 SAFETY (NEEDLE) ×2 IMPLANT
NEEDLE SPNL 18GX3.5 QUINCKE PK (NEEDLE) ×2 IMPLANT
NS IRRIG 1000ML POUR BTL (IV SOLUTION) ×2 IMPLANT
PACK BASIC III (CUSTOM PROCEDURE TRAY) ×1
PACK SRG BSC III STRL LF ECLPS (CUSTOM PROCEDURE TRAY) ×1 IMPLANT
PAD ABD 5X9 TENDERSORB (GAUZE/BANDAGES/DRESSINGS) IMPLANT
PENCIL HANDSWITCHING (ELECTRODE) ×2 IMPLANT
PIN THREADED GUIDE ACE (PIN) ×6 IMPLANT
SCREW CANN 6.5 75MM (Screw) ×1 IMPLANT
SCREW CANN 6.5 90MM (Screw) ×1 IMPLANT
SCREW CANN 6.5 95MM (Screw) ×1 IMPLANT
SCREW CANN LG 6.5 FLT 75X22 (Screw) ×1 IMPLANT
SCREW CANN LG 6.5 FLT 90X22 (Screw) ×1 IMPLANT
SCREW CANN LG 6.5 FLT 95X22 (Screw) ×1 IMPLANT
SET BASIN LINEN APH (SET/KITS/TRAYS/PACK) ×2 IMPLANT
SPONGE LAP 18X18 X RAY DECT (DISPOSABLE) ×2 IMPLANT
STAPLER VISISTAT 35W (STAPLE) ×2 IMPLANT
SUT BRALON NAB BRD #1 30IN (SUTURE) ×2 IMPLANT
SUT MNCRL 0 VIOLET CTX 36 (SUTURE) ×2 IMPLANT
SUT MON AB 2-0 CT1 36 (SUTURE) ×2 IMPLANT
SUT MONOCRYL 0 CTX 36 (SUTURE) ×2
SYR 30ML LL (SYRINGE) ×2 IMPLANT
SYR BULB IRRIGATION 50ML (SYRINGE) ×4 IMPLANT
TOWEL OR 17X26 4PK STRL BLUE (TOWEL DISPOSABLE) ×2 IMPLANT
YANKAUER SUCT BULB TIP 10FT TU (MISCELLANEOUS) ×2 IMPLANT

## 2011-08-30 NOTE — Anesthesia Procedure Notes (Signed)
Spinal  Patient location during procedure: OR Start time: 08/30/2011 1:04 PM Staffing CRNA/Resident: Mackey Varricchio J Performed by: resident/CRNA  Preanesthetic Checklist Completed: patient identified, site marked, surgical consent, pre-op evaluation, timeout performed, IV checked, risks and benefits discussed and monitors and equipment checked Spinal Block Patient position: left lateral decubitus Prep: Betadine Patient monitoring: heart rate, cardiac monitor, continuous pulse ox and blood pressure Approach: left paramedian Location: L2-3 Injection technique: single-shot Needle Needle type: Spinocan  Needle gauge: 22 G Assessment Sensory level: T8 Additional Notes [id number]       09/2012

## 2011-08-30 NOTE — Progress Notes (Signed)
UR Chart Review Completed  

## 2011-08-30 NOTE — Transfer of Care (Signed)
Immediate Anesthesia Transfer of Care Note  Patient: John Chambers  Procedure(s) Performed: Procedure(s) (LRB): CANNULATED HIP PINNING (Left)  Patient Location: PACU  Anesthesia Type: Spinal  Level of Consciousness: awake and patient cooperative  Airway & Oxygen Therapy: Patient Spontanous Breathing and Patient connected to face mask oxygen  Post-op Assessment: Report given to PACU RN and Post -op Vital signs reviewed and stable  Post vital signs: Reviewed and stable  Complications: No apparent anesthesia complications

## 2011-08-30 NOTE — Consult Note (Signed)
Reason for Consult:pain left hip  Referring Physician: DR Kerry Hough  John Chambers is an 78 y.o. male.  HPI: This 78 year old male who lives at home with daytime caregiver presents with a left femoral neck fracture. He fell when going to the bathroom landing on his left hip he could not ambulate. He was brought to the hospital by EMS. His workup revealed a left femoral neck fracture nondisplaced she was admitted for further management. He complains of fairly severe nonradiating pain in his left hip and inability to stand on the left lower extremity. He recently was admitted to the hospital for pneumonia and was treated and returned to his home.  Past Medical History  Diagnosis Date  . Hypertension   . Hypercholesterolemia   . GERD (gastroesophageal reflux disease)   . COPD (chronic obstructive pulmonary disease)   . Chronic insomnia   . BPH (benign prostatic hyperplasia)   . Anemia 08/06/2011    Past Surgical History  Procedure Date  . Colonoscopy 03/31/10    Dr. Darrick Penna  . Right knee     x3  . Nose surgery     History reviewed. No pertinent family history.  Social History:  reports that he has quit smoking. His smoking use included Cigarettes. He has a 13.75 pack-year smoking history. He does not have any smokeless tobacco history on file. He reports that he drinks about 1.2 ounces of alcohol per week. He reports that he does not use illicit drugs.  Allergies:  Allergies  Allergen Reactions  . Milk-Related Compounds Other (See Comments)    Gi UPSET "SOMETIMES"  . Aspirin Itching, Swelling, Rash and Other (See Comments)    GI upset  . Penicillins Rash    Medications: I have reviewed the patient's current medications.  Results for orders placed during the hospital encounter of 08/29/11 (from the past 48 hour(s))  URINALYSIS, ROUTINE W REFLEX MICROSCOPIC     Status: Normal   Collection Time   08/29/11 12:22 PM      Component Value Range Comment   Color, Urine YELLOW  YELLOW     APPearance CLEAR  CLEAR     Specific Gravity, Urine 1.010  1.005 - 1.030     pH 7.0  5.0 - 8.0     Glucose, UA NEGATIVE  NEGATIVE (mg/dL)    Hgb urine dipstick NEGATIVE  NEGATIVE     Bilirubin Urine NEGATIVE  NEGATIVE     Ketones, ur NEGATIVE  NEGATIVE (mg/dL)    Protein, ur NEGATIVE  NEGATIVE (mg/dL)    Urobilinogen, UA 0.2  0.0 - 1.0 (mg/dL)    Nitrite NEGATIVE  NEGATIVE     Leukocytes, UA NEGATIVE  NEGATIVE  MICROSCOPIC NOT DONE ON URINES WITH NEGATIVE PROTEIN, BLOOD, LEUKOCYTES, NITRITE, OR GLUCOSE <1000 mg/dL.  CBC     Status: Abnormal   Collection Time   08/29/11 12:57 PM      Component Value Range Comment   WBC 14.8 (*) 4.0 - 10.5 (K/uL)    RBC 4.45  4.22 - 5.81 (MIL/uL)    Hemoglobin 14.7  13.0 - 17.0 (g/dL)    HCT 40.9  81.1 - 91.4 (%)    MCV 96.6  78.0 - 100.0 (fL)    MCH 33.0  26.0 - 34.0 (pg)    MCHC 34.2  30.0 - 36.0 (g/dL)    RDW 78.2  95.6 - 21.3 (%)    Platelets 214  150 - 400 (K/uL)   DIFFERENTIAL     Status: Abnormal  Collection Time   08/29/11 12:57 PM      Component Value Range Comment   Neutrophils Relative 90 (*) 43 - 77 (%)    Neutro Abs 13.3 (*) 1.7 - 7.7 (K/uL)    Lymphocytes Relative 5 (*) 12 - 46 (%)    Lymphs Abs 0.8  0.7 - 4.0 (K/uL)    Monocytes Relative 5  3 - 12 (%)    Monocytes Absolute 0.7  0.1 - 1.0 (K/uL)    Eosinophils Relative 0  0 - 5 (%)    Eosinophils Absolute 0.0  0.0 - 0.7 (K/uL)    Basophils Relative 0  0 - 1 (%)    Basophils Absolute 0.0  0.0 - 0.1 (K/uL)   COMPREHENSIVE METABOLIC PANEL     Status: Abnormal   Collection Time   08/29/11 12:57 PM      Component Value Range Comment   Sodium 135  135 - 145 (mEq/L)    Potassium 5.1  3.5 - 5.1 (mEq/L)    Chloride 95 (*) 96 - 112 (mEq/L)    CO2 31  19 - 32 (mEq/L)    Glucose, Bld 117 (*) 70 - 99 (mg/dL)    BUN 6  6 - 23 (mg/dL)    Creatinine, Ser 4.09  0.50 - 1.35 (mg/dL)    Calcium 81.1  8.4 - 10.5 (mg/dL)    Total Protein 8.0  6.0 - 8.3 (g/dL)    Albumin 3.8  3.5 - 5.2 (g/dL)      AST 28  0 - 37 (U/L)    ALT 19  0 - 53 (U/L)    Alkaline Phosphatase 85  39 - 117 (U/L)    Total Bilirubin 0.7  0.3 - 1.2 (mg/dL)    GFR calc non Af Amer >90  >90 (mL/min)    GFR calc Af Amer >90  >90 (mL/min)   TYPE AND SCREEN     Status: Normal (Preliminary result)   Collection Time   08/29/11  9:49 PM      Component Value Range Comment   ABO/RH(D) A POS      Antibody Screen NEG      Sample Expiration 09/01/2011      Unit Number [id number]      Blood Component Type RED CELLS,LR      Unit division 00      Status of Unit ALLOCATED      Transfusion Status OK TO TRANSFUSE      Crossmatch Result Compatible      Unit Number [id number]      Blood Component Type RED CELLS,LR      Unit division 00      Status of Unit ALLOCATED      Transfusion Status OK TO TRANSFUSE      Crossmatch Result Compatible     PREPARE RBC (CROSSMATCH)     Status: Normal   Collection Time   08/29/11 10:00 PM      Component Value Range Comment   Order Confirmation ORDER PROCESSED BY BLOOD BANK     ABO/RH     Status: Normal   Collection Time   08/29/11 10:00 PM      Component Value Range Comment   ABO/RH(D) A POS     BASIC METABOLIC PANEL     Status: Abnormal   Collection Time   08/30/11  5:48 AM      Component Value Range Comment   Sodium 132 (*) 135 - 145 (mEq/L)  Potassium 4.2  3.5 - 5.1 (mEq/L) DELTA CHECK NOTED   Chloride 93 (*) 96 - 112 (mEq/L)    CO2 31  19 - 32 (mEq/L)    Glucose, Bld 101 (*) 70 - 99 (mg/dL)    BUN 7  6 - 23 (mg/dL)    Creatinine, Ser 1.32  0.50 - 1.35 (mg/dL)    Calcium 9.1  8.4 - 10.5 (mg/dL)    GFR calc non Af Amer 82 (*) >90 (mL/min)    GFR calc Af Amer >90  >90 (mL/min)   CBC     Status: Abnormal   Collection Time   08/30/11  5:48 AM      Component Value Range Comment   WBC 10.2  4.0 - 10.5 (K/uL)    RBC 3.87 (*) 4.22 - 5.81 (MIL/uL)    Hemoglobin 12.8 (*) 13.0 - 17.0 (g/dL)    HCT 44.0 (*) 10.2 - 52.0 (%)    MCV 98.7  78.0 - 100.0 (fL)    MCH 33.1  26.0 - 34.0  (pg)    MCHC 33.5  30.0 - 36.0 (g/dL)    RDW 72.5  36.6 - 44.0 (%)    Platelets 188  150 - 400 (K/uL)     Dg Hip Complete Left  08/29/2011  *RADIOLOGY REPORT*  Clinical Data: Chronic hip pain  LEFT HIP 2 VIEWS  Comparison: None  Findings: Bones appear demineralized. Minimal narrowing of left hip joint. Pelvis appears intact. Probable impacted subcapital fracture left femoral neck. No dislocation. Degenerative disc disease changes lower lumbar spine. Scattered atherosclerotic calcifications.  IMPRESSION: Probable impacted subcapital fracture left femoral neck. Osseous demineralization. Per CMS PQRS reporting requirements (PQRS Measure 24): Given the patient's age of greater than 50 and the fracture site (hip, distal radius, or spine), the patient should be tested for osteoporosis using DXA, and the appropriate treatment considered based on the DXA results.  Original Report Authenticated By: Lollie Marrow, M.D.   Dg Chest Portable 1 View  08/29/2011  *RADIOLOGY REPORT*  Clinical Data: Hip pain, hypertension, COPD, recent pneumonia  PORTABLE CHEST - 1 VIEW  Comparison: Portable exam 1238 hours repeated at 1245 hours compared to 08/03/2011  Findings: Mild rotation to the right. Normal heart size and pulmonary vascularity. Atherosclerotic calcification of mildly tortuous thoracic aorta. Emphysematous changes with right basilar effusion and atelectasis versus infiltrate. Suspected infiltrate medial left lung base. No pneumothorax. Bones diffusely demineralized.  IMPRESSION: Emphysematous changes with right basilar atelectasis versus infiltrate and associated right pleural effusion. Suspected infiltrate medial left lung base.  Original Report Authenticated By: Lollie Marrow, M.D.    ROS Blood pressure 91/58, pulse 88, temperature 98.3 F (36.8 C), temperature source Oral, resp. rate 19, height 5\' 8"  (1.727 m), weight 143 lb 4.8 oz (65 kg), SpO2 95.00%. Physical Exam  Musculoskeletal:       Vital signs are  stable as recorded BP 91/58  Pulse 88  Temp(Src) 98.3 F (36.8 C) (Oral)  Resp 19  Ht 5\' 8"  (1.727 m)  Wt 143 lb 4.8 oz (65 kg)  BMI 21.79 kg/m2  SpO2 95%   General appearance is normal, FRAME IS SMALL   The patient is alert and oriented x3  The patient's mood and affect are normal  Gait assessment: CAN NOT AMBULATE  The cardiovascular exam reveals normal pulses and temperature without edema swelling.  The lymphatic system is negative for palpable lymph nodes  The sensory exam is normal.  There are no pathologic reflexes.  Balance is normal.   Exam of the UPPER EXTREMITIES:  Upper extremity exam  Inspection and palpation revealed no abnormalities in the upper extremities.  Range of motion is full without contracture.  Motor exam is normal with grade 5 strength.  The joints are fully reduced without subluxation.  There is no atrophy or tremor and muscle tone is normal.  All joints are stable.    RIGHT LOWER EXTREMITY NORMAL  Inspection left hip held in flexion. And tenderness over the left greater trochanter and proximal thigh. Range of motion minimal range of motion without pain and discomfort Stability NORMAL Strength MUSCLE TONE IS NORMAL  Skin NORMAL      Assessment/Plan: X-RAYS REVIEWED  I've spoken with the patient and his granddaughter who is a nurse here, he would like to proceed with internal fixation of the left hip with cannulated screws. The risks have been explained to him the option of nonsurgical treatment carries a high risk of further displacement and the need for further more complicated surgery. The cannulated screws have a 15% chance of avascular necrosis and or nonunion which would require partial hip replacement.  Left femoral neck fracture  Cannulated screws left hip  John Chambers 08/30/2011, 7:49 AM

## 2011-08-30 NOTE — Progress Notes (Signed)
Subjective: Patient returned to his room from surgery, multiple family members in the room, he denies any complaints at this time, in good spirits  Objective: Vital signs in last 24 hours: Temp:  [97.3 F (36.3 C)-98.3 F (36.8 C)] 97.3 F (36.3 C) (05/14 1533) Pulse Rate:  [74-95] 74  (05/14 1605) Resp:  [16-24] 18  (05/14 1605) BP: (91-156)/(58-96) 110/70 mmHg (05/14 1605) SpO2:  [89 %-100 %] 96 % (05/14 1605) Weight change:  Last BM Date: 08/28/11  Intake/Output from previous day: 05/13 0701 - 05/14 0700 In: 1390 [P.O.:480; I.V.:910] Out: 200 [Urine:200] Total I/O In: 1200 [P.O.:100; I.V.:1100] Out: 300 [Urine:250; Blood:50]   Physical Exam: General: Alert, awake, oriented x3, in no acute distress. HEENT: No bruits, no goiter. Heart: Regular rate and rhythm, without murmurs, rubs, gallops. Lungs: Clear to auscultation bilaterally. Abdomen: Soft, nontender, nondistended, positive bowel sounds. Extremities: No clubbing cyanosis or edema with positive pedal pulses. Neuro: Grossly intact, nonfocal.    Lab Results: Basic Metabolic Panel:  Basename 08/30/11 0548 08/29/11 1257  NA 132* 135  K 4.2 5.1  CL 93* 95*  CO2 31 31  GLUCOSE 101* 117*  BUN 7 6  CREATININE 0.78 0.60  CALCIUM 9.1 10.1  MG -- --  PHOS -- --   Liver Function Tests:  Basename 08/29/11 1257  AST 28  ALT 19  ALKPHOS 85  BILITOT 0.7  PROT 8.0  ALBUMIN 3.8   No results found for this basename: LIPASE:2,AMYLASE:2 in the last 72 hours No results found for this basename: AMMONIA:2 in the last 72 hours CBC:  Basename 08/30/11 0548 08/29/11 1257  WBC 10.2 14.8*  NEUTROABS -- 13.3*  HGB 12.8* 14.7  HCT 38.2* 43.0  MCV 98.7 96.6  PLT 188 214   Cardiac Enzymes: No results found for this basename: CKTOTAL:3,CKMB:3,CKMBINDEX:3,TROPONINI:3 in the last 72 hours BNP: No results found for this basename: PROBNP:3 in the last 72 hours D-Dimer: No results found for this basename: DDIMER:2 in the  last 72 hours CBG: No results found for this basename: GLUCAP:6 in the last 72 hours Hemoglobin A1C: No results found for this basename: HGBA1C in the last 72 hours Fasting Lipid Panel: No results found for this basename: CHOL,HDL,LDLCALC,TRIG,CHOLHDL,LDLDIRECT in the last 72 hours Thyroid Function Tests: No results found for this basename: TSH,T4TOTAL,FREET4,T3FREE,THYROIDAB in the last 72 hours Anemia Panel: No results found for this basename: VITAMINB12,FOLATE,FERRITIN,TIBC,IRON,RETICCTPCT in the last 72 hours Coagulation: No results found for this basename: LABPROT:2,INR:2 in the last 72 hours Urine Drug Screen: Drugs of Abuse  No results found for this basename: labopia, cocainscrnur, labbenz, amphetmu, thcu, labbarb    Alcohol Level: No results found for this basename: ETH:2 in the last 72 hours Urinalysis:  Basename 08/29/11 1222  COLORURINE YELLOW  LABSPEC 1.010  PHURINE 7.0  GLUCOSEU NEGATIVE  HGBUR NEGATIVE  BILIRUBINUR NEGATIVE  KETONESUR NEGATIVE  PROTEINUR NEGATIVE  UROBILINOGEN 0.2  NITRITE NEGATIVE  LEUKOCYTESUR NEGATIVE    Recent Results (from the past 240 hour(s))  SURGICAL PCR SCREEN     Status: Normal   Collection Time   08/30/11  8:27 AM      Component Value Range Status Comment   MRSA, PCR NEGATIVE  NEGATIVE  Final    Staphylococcus aureus NEGATIVE  NEGATIVE  Final     Studies/Results: Dg Hip Complete Left  08/29/2011  *RADIOLOGY REPORT*  Clinical Data: Chronic hip pain  LEFT HIP 2 VIEWS  Comparison: None  Findings: Bones appear demineralized. Minimal narrowing of left hip  joint. Pelvis appears intact. Probable impacted subcapital fracture left femoral neck. No dislocation. Degenerative disc disease changes lower lumbar spine. Scattered atherosclerotic calcifications.  IMPRESSION: Probable impacted subcapital fracture left femoral neck. Osseous demineralization. Per CMS PQRS reporting requirements (PQRS Measure 24): Given the patient's age of greater  than 50 and the fracture site (hip, distal radius, or spine), the patient should be tested for osteoporosis using DXA, and the appropriate treatment considered based on the DXA results.  Original Report Authenticated By: Lollie Marrow, M.D.   Dg Hip Operative Left  08/30/2011  *RADIOLOGY REPORT*  Clinical Data: Left hip fracture  OPERATIVE LEFT HIP  Comparison: 08/29/2011  Findings: C-arm documents placement of surgical pins across a slightly impacted subcapital left femoral neck fracture.  No adverse features.  IMPRESSION: As above.  Original Report Authenticated By: Elsie Stain, M.D.   Dg Chest Portable 1 View  08/29/2011  *RADIOLOGY REPORT*  Clinical Data: Hip pain, hypertension, COPD, recent pneumonia  PORTABLE CHEST - 1 VIEW  Comparison: Portable exam 1238 hours repeated at 1245 hours compared to 08/03/2011  Findings: Mild rotation to the right. Normal heart size and pulmonary vascularity. Atherosclerotic calcification of mildly tortuous thoracic aorta. Emphysematous changes with right basilar effusion and atelectasis versus infiltrate. Suspected infiltrate medial left lung base. No pneumothorax. Bones diffusely demineralized.  IMPRESSION: Emphysematous changes with right basilar atelectasis versus infiltrate and associated right pleural effusion. Suspected infiltrate medial left lung base.  Original Report Authenticated By: Lollie Marrow, M.D.    Medications: Scheduled Meds:   . acetaminophen  1,000 mg Intravenous Once  . acetaminophen  1,000 mg Intravenous Q6H  . atorvastatin  10 mg Oral Daily  . bupivacaine 0.75% in dextrose 8.25% (intrathecal)      . calcium carbonate  1,250 mg Oral Daily  . docusate sodium  100 mg Oral BID  . enoxaparin  30 mg Subcutaneous Q24H  . ePHEDrine      . fentaNYL      . Fluticasone-Salmeterol  1 puff Inhalation Q12H  . guaiFENesin  1,200 mg Oral BID  . lisinopril  10 mg Oral Daily   And  . hydrochlorothiazide  12.5 mg Oral Daily  . levalbuterol  1  puff Inhalation TID  . methocarbamol (ROBAXIN) IV  500 mg Intravenous Q6H  . midazolam      . midazolam      . midazolam      . mirtazapine  15 mg Oral QHS  . ondansetron      . ondansetron (ZOFRAN) IV  4 mg Intravenous Once  . ondansetron (ZOFRAN) IV  4 mg Intravenous Once  . phenylephrine      . propofol      . sodium chloride      . Tamsulosin HCl  0.4 mg Oral Daily  . traMADol  50 mg Oral Once  . traMADol  50 mg Oral Q6H  . vancomycin      . vancomycin  1,000 mg Intravenous Q12H  . DISCONTD: chlorhexidine  60 mL Topical Once  . DISCONTD: enoxaparin  40 mg Subcutaneous Q24H  . DISCONTD: methocarbamol (ROBAXIN) IV  500 mg Intravenous Once  . DISCONTD: vancomycin  1,000 mg Intravenous 60 min Pre-Op   Continuous Infusions:   . sodium chloride 75 mL/hr at 08/29/11 1651  . sodium chloride    . DISCONTD: lactated ringers     PRN Meds:.acetaminophen, acetaminophen, albuterol, alum & mag hydroxide-simeth, bisacodyl, HYDROmorphone (DILAUDID) injection, lactase, menthol-cetylpyridinium, metoCLOPramide, midazolam, ondansetron (ZOFRAN) IV,  phenol, polyvinyl alcohol, senna-docusate, sodium phosphate, DISCONTD: acetaminophen, DISCONTD: acetaminophen, DISCONTD: Bupivacaine-Epinephrine PF, DISCONTD: fentaNYL, DISCONTD:  HYDROmorphone (DILAUDID) injection, DISCONTD: ondansetron (ZOFRAN) IV DISCONTD: ondansetron (ZOFRAN) IV, DISCONTD: ondansetron, DISCONTD: sodium chloride irrigation  Assessment/Plan:  Principal Problem:  *Hip fracture requiring operative repair, left, closed, initial encounter Active Problems:  HTN (hypertension)  Hyperlipidemia  COPD (chronic obstructive pulmonary disease)  GERD (gastroesophageal reflux disease)  Chronic respiratory failure  Plan:  Hip fx, s/p repair, PT to evaluate, continue DVT prophylaxis and pain management  COPD, stable, encourage incentive spirometry, continue outpatient meds, continue supplemental oxygen which he was using prior to  admission  HTN, stable  Anticipate discharge to SNF for rehab soon.   LOS: 1 day   Chloeanne Poteet [facility] Pager: [phone] 08/30/2011, 4:33 PM

## 2011-08-30 NOTE — ED Provider Notes (Signed)
Medical screening examination/treatment/procedure(s) were performed by non-physician practitioner and as supervising physician I was immediately available for consultation/collaboration.   Laray Anger, DO 08/30/11 [phone]

## 2011-08-30 NOTE — Clinical Social Work Note (Signed)
CSW presented bed offers to pt's granddaughter who chooses [facility].  Facility notified.  Pt returning from surgery.  Awaiting stability for d/c.  John Chambers

## 2011-08-30 NOTE — Op Note (Signed)
Surgery May 14  Procedure open treatment  internal fixation left hip for femoral neck fracture  Indications pain non-displaced femoral neck fracture inability to walk  Diagnosis closed left femoral neck fracture  Postoperative diagnosis same  Procedure open treatment internal fixation left femoral neck with cannulated screws x3, implant manufacturer [employer], screws were titanium  Surgeon Romeo Apple  Assisted by [facility]  Anesthetic spinal  Operative findings nondisplaced femoral neck fracture left hip  Details of procedure the patient was given a gram of vancomycin secondary to penicillin allergy causing a rash. He was taken to the operating room after site marking. A spinal anesthetic and was placed on the fracture table. His left leg was placed in traction device with the peroneal post in place. His right leg was padded and placed in a well leg holder  C-arm was brought into obtained preliminary x-rays. The fracture was reduced and and stable configuration.  The Left thigh was prepped and draped in sterile technique  A time out was completed  The incision was made distal to the greater trochanter on the lateral thigh. The subcutaneous tissue was divided;  hemostasis was obtained with electrocautery. The deep fascia was opened. The vastus lateralis was split in line with its muscle fibers. Subperiosteal dissection exposed the lateral femur. Retractors were placed deep.  A guidewire was placed in the inferior neck on the anterior posterior x-ray and center of the neck and head on the lateral x-ray. A multipara in guide was then placed and 2 additional K wires were placed under C-arm guidance in a triangular configuration.  Satisfactory position was obtained. The pins were individually and sequentially measured followed by drilling of the outer cortex and passing of the screws are were the guidewire. The screws were tightened by hand. The guidewires were removed and final x-rays  confirmed stable reduction and acceptable reduction.  The wound was then irrigated and closed in layered fashion with 0 Monocryl in the vastus fascia. 0 Monocryl and the second fascial layer and 2-0 Monocryl and the subcutaneous tissue. 60 cc of Marcaine was placed into the wound deep and under the fascia. Skin was reapproximated with skin staples  The patient will be weight bearing as tolerated and will be on DVT prevention for 30 days.

## 2011-08-30 NOTE — Care Management Note (Addendum)
    Page 1 of 1   09/01/2011     3:16:29 PM   CARE MANAGEMENT NOTE 09/01/2011  Patient:  John Chambers, John Chambers   Account Number:  [id number]  Date Initiated:  08/30/2011  Documentation initiated by:  John Chambers  Subjective/Objective Assessment:   Pt admitted with a hip fracture. PTA lived at home alone with an aide M-F 10-4 for assistance.     Action/Plan:   Spoke with pt and granddaughter and they plan to DC to SNF for rehab. State they just spoke w/ CSW about bed placement. after surgery.   Anticipated DC Date:  09/02/2011   Anticipated DC Plan:  SKILLED NURSING FACILITY  In-house referral  Clinical Social Worker      DC Associate Professor  CM consult      [facility] Choice  HOME HEALTH  DURABLE MEDICAL EQUIPMENT   Choice offered to / List presented to:  C-4 Adult Children   DME arranged  BEDSIDE COMMODE  HOSPITAL BED  WALKER - John Chambers      DME agency  [facility]     HH arranged  HH-1 RN  HH-2 PT      [facility] agency  [facility]   Status of service:  Completed, signed off Medicare Important Message given?   (If response is "NO", the following Medicare IM given date fields will be blank) Date Medicare IM given:   Date Additional Medicare IM given:    Discharge Disposition:  SKILLED NURSING FACILITY  Per UR Regulation:    If discussed at Long Length of Stay Meetings, dates discussed:    Comments:  09/01/11 1420 John Queen, RN BSN CM Pt discharged to [facility]. Once pt arrived to [facility], he refused to stay and be admitted. Pt stated he was going home. John Braun, RN from [facility] spoke with John Chambers, CSW at [facility] to inquire about [facility] for pt. CM spoke with pts daughter, John Chambers, who stated that she could not leave her father at the [facility] and was taking him home. Pt has a sitter for 8 hours a day. John Chambers stated that she and her sister would provide around the clock care for her father at home. John Chambers chose [facility] for [facility] and DME. MD placed orders in EPIC  for [facility] and DME. John Chambers of [facility] is aware and will collect information from the pts chart. John Chambers was called for DME that needs to be delivered today. HH services will start on 09/02/2011. John Chambers is aware that pt may be responsible for paying out of pocket for equipment if insuranced does not approve. John Chambers stated that would not be a problem. John of [facility] stated that DME will be delivered this pm. Family is aware.   09/01/11 1000 John Holms RN BSN CM  08/30/11 1100 John Leanord Hawking RN BSN CM

## 2011-08-30 NOTE — Anesthesia Postprocedure Evaluation (Signed)
  Anesthesia Post-op Note  Patient: John Chambers  Procedure(s) Performed: Procedure(s) (LRB): CANNULATED HIP PINNING (Left)  Patient Location: PACU  Anesthesia Type: Spinal  Level of Consciousness: awake, alert  and patient cooperative  Airway and Oxygen Therapy: Patient Spontanous Breathing  Post-op Pain: 4 /10, moderate  Post-op Assessment: Post-op Vital signs reviewed, Patient's Cardiovascular Status Stable, Respiratory Function Stable, Patent Airway, No signs of Nausea or vomiting and Pain level controlled  Post-op Vital Signs: Reviewed and stable  Complications: No apparent anesthesia complications

## 2011-08-30 NOTE — Clinical Social Work Note (Signed)
CSW met with pt and pt's granddaughter at bedside.  Pt is aware he will most likely need SNF at d/c and gave permission for CSW to initiate search.  CSW will follow up when bed offers received. Full note in shadow chart.   Karn Cassis

## 2011-08-30 NOTE — Anesthesia Preprocedure Evaluation (Signed)
Anesthesia Evaluation  Patient identified by MRN, date of birth, ID band Patient awake    Reviewed: Allergy & Precautions, H&P , NPO status , Patient's Chart, lab work & pertinent test results  Airway Mallampati: II      Dental  (+) Edentulous Upper and Edentulous Lower   Pulmonary shortness of breath and with exertion, COPD COPD inhaler, former smoker breath sounds clear to auscultation        Cardiovascular hypertension, Pt. on medications Rhythm:Regular Rate:Normal     Neuro/Psych    GI/Hepatic GERD-  Medicated and Controlled,  Endo/Other    Renal/GU      Musculoskeletal   Abdominal   Peds  Hematology   Anesthesia Other Findings   Reproductive/Obstetrics                           Anesthesia Physical Anesthesia Plan  ASA: III  Anesthesia Plan: Spinal   Post-op Pain Management:    Induction:   Airway Management Planned: Nasal Cannula  Additional Equipment:   Intra-op Plan:   Post-operative Plan:   Informed Consent: I have reviewed the patients History and Physical, chart, labs and discussed the procedure including the risks, benefits and alternatives for the proposed anesthesia with the patient or authorized representative who has indicated his/her understanding and acceptance.     Plan Discussed with:   Anesthesia Plan Comments:         Anesthesia Quick Evaluation

## 2011-08-30 NOTE — Brief Op Note (Signed)
08/29/2011 - 08/30/2011  2:17 PM  PATIENT:  John Chambers  78 y.o. male  PRE-OPERATIVE DIAGNOSIS:  left femoral neck fracture  POST-OPERATIVE DIAGNOSIS:  left femoral neck fracture  PROCEDURE:  Procedure(s) (LRB): CANNULATED HIP PINNING (Left)  SURGEON:  Surgeon(s) and Role:    * Vickki Hearing, MD - Primary  PHYSICIAN ASSISTANT:   ASSISTANTS: debi dallas   ANESTHESIA:   spinal  EBL:  Total I/O In: 1000 [I.V.:1000] Out: 100 [Urine:100]  BLOOD ADMINISTERED:none  DRAINS: none   LOCAL MEDICATIONS USED:  MARCAINE   , Amount: 60 ml and OTHER epi  SPECIMEN:  No Specimen  DISPOSITION OF SPECIMEN:  N/A  COUNTS:  YES  TOURNIQUET:  * No tourniquets in log *  DICTATION: .Dragon Dictation  PLAN OF CARE: Admit to inpatient   PATIENT DISPOSITION:  PACU - hemodynamically stable.   Delay start of Pharmacological VTE agent (>24hrs) due to surgical blood loss or risk of bleeding: yes

## 2011-08-31 DIAGNOSIS — S72009A Fracture of unspecified part of neck of unspecified femur, initial encounter for closed fracture: Secondary | ICD-10-CM

## 2011-08-31 DIAGNOSIS — J9610 Chronic respiratory failure, unspecified whether with hypoxia or hypercapnia: Secondary | ICD-10-CM

## 2011-08-31 DIAGNOSIS — J438 Other emphysema: Secondary | ICD-10-CM

## 2011-08-31 DIAGNOSIS — I10 Essential (primary) hypertension: Secondary | ICD-10-CM

## 2011-08-31 LAB — BASIC METABOLIC PANEL
CO2: 32 mEq/L (ref 19–32)
Chloride: 96 mEq/L (ref 96–112)
GFR calc non Af Amer: 85 mL/min — ABNORMAL LOW (ref >90)
Glucose, Bld: 96 mg/dL (ref 70–99)
Potassium: 4.5 mEq/L (ref 3.5–5.1)
Sodium: 133 mEq/L — ABNORMAL LOW (ref 135–145)

## 2011-08-31 LAB — CBC
HCT: 32.3 % — ABNORMAL LOW (ref 39.0–52.0)
Hemoglobin: 10.7 g/dL — ABNORMAL LOW (ref 13.0–17.0)
RBC: 3.27 MIL/uL — ABNORMAL LOW (ref 4.22–5.81)

## 2011-08-31 MED ORDER — TRAMADOL HCL 50 MG PO TABS: 50.0000 mg | ORAL_TABLET | Freq: Four times a day (QID) | ORAL | Status: AC

## 2011-08-31 MED ORDER — ENOXAPARIN SODIUM 30 MG/0.3ML ~~LOC~~ SOLN: 30.0000 mg | SUBCUTANEOUS | Status: DC

## 2011-08-31 NOTE — Progress Notes (Signed)
Physical Therapy Treatment Patient Details Name: John Chambers MRN: [id number] DOB: Aug 03, 1926 Today's Date: 08/31/2011 Time: 4098-1191 PT Time Calculation (min): 24 min  PT Assessment / Plan / Recommendation Comments on Treatment Session  Pt maintaining excellent pain control, tolerating increased activity well...able to ambulate 15' with walker, min guard assist.    Follow Up Recommendations  Skilled nursing facility    Barriers to Discharge Decreased caregiver support      Equipment Recommendations  Defer to next venue    Recommendations for Other Services    Frequency Min 6X/week   Plan Discharge plan remains appropriate;Frequency remains appropriate    Precautions / Restrictions Precautions Precautions: Fall Restrictions Weight Bearing Restrictions: No   Pertinent Vitals/Pain     Mobility  Bed Mobility Bed Mobility: Supine to Sit;Sit to Supine;Sitting - Scoot to Edge of Bed Supine to Sit: 4: Min assist;HOB elevated Sitting - Scoot to [place] of Bed: 4: Min guard Sit to Supine: 4: Min assist Transfers Transfers: Sit to Stand;Stand to Sit Sit to Stand: 4: Min guard;With upper extremity assist;From chair/3-in-1 Stand to Sit: 4: Min guard;With upper extremity assist;To bed Ambulation/Gait Ambulation/Gait Assistance: 4: Min guard Ambulation Distance (Feet): 15 Feet Assistive device: Rolling walker Gait Pattern: Step-through pattern;Decreased stance time - left;Decreased step length - left General Gait Details: 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.Marland KitchenMarland Kitchenpt is aware and is very cautious with all gait Stairs: No Wheelchair Mobility Wheelchair Mobility: No    Exercises General Exercises - Lower Extremity Ankle Circles/Pumps: AROM;Both;10 reps;Supine Quad Sets: AROM;Both;10 reps;Supine Gluteal Sets: AROM;Both;10 reps;Supine Short Arc Quad: AAROM;Left;10 reps;Supine Heel Slides: AAROM;Left;10 reps;Supine Hip ABduction/ADduction: AAROM;Left;10 reps;Supine   PT  Diagnosis: Difficulty walking;Abnormality of gait;Acute pain  PT Problem List: Decreased strength;Decreased range of motion;Decreased activity tolerance;Decreased mobility;Decreased knowledge of use of DME;Decreased safety awareness;Decreased knowledge of precautions;Pain PT Treatment Interventions: DME instruction;Functional mobility training;Therapeutic activities;Therapeutic exercise;Patient/family education   PT Goals Acute Rehab PT Goals PT Goal Formulation: With patient Time For Goal Achievement: 09/07/11 Potential to Achieve Goals: Good Pt will go Supine/Side to Sit: with supervision;with HOB not 0 degrees (comment degree) PT Goal: Supine/Side to Sit - Progress: Goal set today Pt will go Sit to Supine/Side: with min assist;with HOB not 0 degrees (comment degree) PT Goal: Sit to Supine/Side - Progress: Progressing toward goal Pt will go Sit to Stand: with modified independence PT Goal: Sit to Stand - Progress: Progressing toward goal Pt will go Stand to Sit: with modified independence;with upper extremity assist PT Goal: Stand to Sit - Progress: Progressing toward goal Pt will Transfer Bed to Chair/Chair to Bed: with min assist PT Transfer Goal: Bed to Chair/Chair to Bed - Progress: Goal set today Pt will Ambulate: 16 - 50 feet;with rolling walker PT Goal: Ambulate - Progress: Updated due to goal met  Visit Information  Last PT Received On: 08/31/11    Subjective Data  Subjective: I feel pretty good Patient Stated Goal: return home   Cognition  Overall Cognitive Status: Appears within functional limits for tasks assessed/performed Arousal/Alertness: Awake/alert Orientation Level: Appears intact for tasks assessed Behavior During Session: [facility] for tasks performed    Balance  Balance Balance Assessed: No  End of Session PT - End of Session Equipment Utilized During Treatment: Gait belt Activity Tolerance: Patient tolerated treatment well Patient left: in bed;with call  bell/phone within reach Nurse Communication: Mobility status    Konrad Penta 08/31/2011, 12:08 PM

## 2011-08-31 NOTE — Progress Notes (Signed)
Patient ID: John Chambers, male   DOB: 06-24-1926, 78 y.o.   MRN: [id number] BP 124/81  Pulse 73  Temp(Src) 97.6 F (36.4 C) (Oral)  Resp 18  Ht 5\' 8"  (1.727 m)  Wt 65 kg (143 lb 4.8 oz)  BMI 21.79 kg/m2  SpO2 96%  Postop day 1 status post open treatment internal fixation left hip with cannulated screws the left femoral neck fracture  Hemoglobin is 10.7  Pain levels are ranging between 4 and 7 on IV Tylenol, tramadol 50 mg every 6 and Tylenol at 0.5 mg every 2 when necessary  Today the patient should get out of bed and start physical therapy and start discharge planning. The Foley catheter should come out as well. DVT prophylaxis should began with Lovenox.

## 2011-08-31 NOTE — Evaluation (Signed)
Physical Therapy Evaluation Patient Details Name: John Chambers MRN: [id number] DOB: [birth year] Today's Date: 08/31/2011 Time: 0830-0905 PT Time Calculation (min): 35 min  PT Assessment / Plan / Recommendation Clinical Impression  A delightful pt was seen today for initial eval.  Only min to min guard assist was needed to transfer bed to chair. He will need SNF at d/c to develop prior function.    PT Assessment  Patient needs continued PT services    Follow Up Recommendations  Skilled nursing facility    Barriers to Discharge Decreased caregiver support      lEquipment Recommendations  Defer to next venue    Recommendations for Other Services     Frequency Min 6X/week    Precautions / Restrictions Precautions Precautions: Fall Restrictions Weight Bearing Restrictions: No LLE Weight Bearing:  (pt seeing patient)   Pertinent Vitals/Pain       Mobility  Bed Mobility Bed Mobility: Supine to Sit;Sit to Supine;Sitting - Scoot to Edge of Bed Supine to Sit: 4: Min assist;HOB elevated Sitting - Scoot to [place] of Bed: 4: Min guard Sit to Supine: Not Tested (comment) Transfers Transfers: Sit to Stand;Stand to Sit Sit to Stand: 4: Min guard Stand to Sit: 4: Min guard Ambulation/Gait Ambulation/Gait Assistance: 4: Min Environmental consultant (Feet): 3 Feet Assistive device: Rolling walker Gait Pattern: Step-to pattern;Decreased stance time - left;Decreased step length - left;Trunk flexed General Gait Details: 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.Marland KitchenMarland Kitchenpt is aware and is very cautious with all gait Stairs: No Wheelchair Mobility Wheelchair Mobility: No    Exercises General Exercises - Lower Extremity Ankle Circles/Pumps: AROM;Both;10 reps;Supine Quad Sets: AROM;Both;10 reps;Supine Gluteal Sets: AROM;Both;10 reps;Supine Short Arc Quad: AAROM;Left;10 reps;Supine Heel Slides: AAROM;Left;10 reps;Supine Hip ABduction/ADduction: AAROM;Left;10 reps;Supine   PT  Diagnosis: Difficulty walking;Abnormality of gait;Acute pain  PT Problem List: Decreased strength;Decreased range of motion;Decreased activity tolerance;Decreased mobility;Decreased knowledge of use of DME;Decreased safety awareness;Decreased knowledge of precautions;Pain PT Treatment Interventions: DME instruction;Functional mobility training;Therapeutic activities;Therapeutic exercise;Patient/family education   PT Goals Acute Rehab PT Goals PT Goal Formulation: With patient Time For Goal Achievement: 09/07/11 Potential to Achieve Goals: Good Pt will go Supine/Side to Sit: with supervision;with HOB not 0 degrees (comment degree) PT Goal: Supine/Side to Sit - Progress: Goal set today Pt will go Sit to Supine/Side: with min assist;with HOB not 0 degrees (comment degree) PT Goal: Sit to Supine/Side - Progress: Goal set today Pt will go Sit to Stand: with modified independence PT Goal: Sit to Stand - Progress: Goal set today Pt will go Stand to Sit: with modified independence;with upper extremity assist PT Goal: Stand to Sit - Progress: Goal set today Pt will Transfer Bed to Chair/Chair to Bed: with min assist PT Transfer Goal: Bed to Chair/Chair to Bed - Progress: Goal set today Pt will Ambulate: 1 - 15 feet;with min assist PT Goal: Ambulate - Progress: Goal set today  Visit Information  Last PT Received On: 08/31/11    Subjective Data  Subjective: I feel pretty good Patient Stated Goal: return home   Prior Functioning  Home Living Lives With: Alone Available Help at Discharge: Personal care attendant;Family Type of Home: House Home Access: Level entry Home Layout: One level;Two level;Able to live on main level with bedroom/bathroom Home Adaptive Equipment: Straight cane Prior Function Level of Independence: Needs assistance Able to Take Stairs?: No Vocation: Retired Musician: No difficulties    Cognition  Overall Cognitive Status: Appears within  functional limits for  tasks assessed/performed Arousal/Alertness: Awake/alert Orientation Level: Appears intact for tasks assessed Behavior During Session: [facility] for tasks performed    Extremity/Trunk Assessment Right Upper Extremity Assessment RUE ROM/Strength/Tone: WFL for tasks assessed RUE Sensation: WFL - Light Touch;WFL - Proprioception RUE Coordination: WFL - gross motor Left Upper Extremity Assessment LUE ROM/Strength/Tone: WFL for tasks assessed LUE Sensation: WFL - Light Touch;WFL - Proprioception LUE Coordination: WFL - gross motor Right Lower Extremity Assessment RLE ROM/Strength/Tone: WFL for tasks assessed (R knee extension is -20 deg--severe DJD) RLE Sensation: WFL - Light Touch;WFL - Proprioception RLE Coordination: WFL - gross motor Left Lower Extremity Assessment LLE ROM/Strength/Tone: Unable to fully assess LLE Sensation: WFL - Light Touch Trunk Assessment Trunk Assessment: Normal   Balance Balance Balance Assessed: No  End of Session PT - End of Session Equipment Utilized During Treatment: Gait belt Activity Tolerance: Patient tolerated treatment well Patient left: in chair;with call bell/phone within reach;with chair alarm set Nurse Communication: Mobility status   Konrad Penta 08/31/2011, 9:16 AM

## 2011-08-31 NOTE — Discharge Summary (Addendum)
Physician Discharge Summary  Patient ID: John Chambers MRN: [id number] DOB/AGE: 19-Jun-1926 78 y.o.  Admit date: 08/29/2011 Discharge date: 09/01/2011  Primary Care Physician:  Isabella Bowens, MD, MD   Discharge Diagnoses:    Principal Problem:  *Hip fracture requiring operative repair, left, closed, initial encounter Active Problems:  HTN (hypertension)  Hyperlipidemia  COPD (chronic obstructive pulmonary disease)  GERD (gastroesophageal reflux disease)  Chronic respiratory failure Acute blood loss anemia, post operative   Medication List  As of 08/31/2011 11:58 AM   TAKE these medications         acetaminophen 500 MG tablet   Commonly known as: TYLENOL   Take 500 mg by mouth every 6 (six) hours as needed. Pain      ADVAIR DISKUS 250-50 MCG/DOSE Aepb   Generic drug: Fluticasone-Salmeterol   Inhale 1 puff into the lungs every 12 (twelve) hours.      albuterol (2.5 MG/3ML) 0.083% nebulizer solution   Commonly known as: PROVENTIL   Take 2.5 mg by nebulization every 6 (six) hours as needed. For shortness  Of breath      atorvastatin 10 MG tablet   Commonly known as: LIPITOR   Take 10 mg by mouth daily.      calcium carbonate 600 MG Tabs   Commonly known as: OS-CAL   Take 600 mg by mouth daily.      enoxaparin 30 MG/0.3ML injection   Commonly known as: LOVENOX   Inject 0.3 mLs (30 mg total) into the skin daily. Until 6/11      guaiFENesin 600 MG 12 hr tablet   Commonly known as: MUCINEX   Take 600 mg by mouth daily.      hydroxypropyl methylcellulose 2.5 % ophthalmic solution   Commonly known as: ISOPTO TEARS   Place 1 drop into both eyes daily as needed. Dry Eyes      lactase 3000 UNITS tablet   Commonly known as: LACTAID   Take 1 tablet by mouth daily as needed. Lactose Intolerance      levalbuterol 45 MCG/ACT inhaler   Commonly known as: XOPENEX HFA   Inhale 1 puff into the lungs 3 (three) times daily.      lisinopril-hydrochlorothiazide  10-12.5 MG per tablet   Commonly known as: PRINZIDE,ZESTORETIC   Take 1 tablet by mouth daily.      mirtazapine 15 MG tablet   Commonly known as: REMERON   Take 15 mg by mouth at bedtime.      Tamsulosin HCl 0.4 MG Caps   Commonly known as: FLOMAX   Take 0.4 mg by mouth daily.      traMADol 50 MG tablet   Commonly known as: ULTRAM   Take 1 tablet (50 mg total) by mouth every 6 (six) hours.        Senokot-S one tablet twice a day as required.    Physical Exam: Blood pressure 124/81, pulse 73, temperature 97.6 F (36.4 C), temperature source Oral, resp. rate 20, height 5\' 8"  (1.727 m), weight 65 kg (143 lb 4.8 oz), SpO2 91.00%. NAD CTA B S1, S2, RRR Soft, NT, BS+ No edema b/l  Disposition and Follow-up:  Patient will be discharged to a skilled nursing facility for short term physical rehabilitation  Consults:  Orthopedics, Dr. Romeo Apple   Significant Diagnostic Studies:  Dg Hip Operative Left  09-14-11  *RADIOLOGY REPORT*  Clinical Data: Left hip fracture  OPERATIVE LEFT HIP  Comparison: 08/29/2011  Findings: C-arm documents placement of surgical pins across  a slightly impacted subcapital left femoral neck fracture.  No adverse features.  IMPRESSION: As above.  Original Report Authenticated By: Elsie Stain, M.D.   Dg Chest Portable 1 View  08/29/2011  *RADIOLOGY REPORT*  Clinical Data: Hip pain, hypertension, COPD, recent pneumonia  PORTABLE CHEST - 1 VIEW  Comparison: Portable exam 1238 hours repeated at 1245 hours compared to 08/03/2011  Findings: Mild rotation to the right. Normal heart size and pulmonary vascularity. Atherosclerotic calcification of mildly tortuous thoracic aorta. Emphysematous changes with right basilar effusion and atelectasis versus infiltrate. Suspected infiltrate medial left lung base. No pneumothorax. Bones diffusely demineralized.  IMPRESSION: Emphysematous changes with right basilar atelectasis versus infiltrate and associated right pleural  effusion. Suspected infiltrate medial left lung base.  Original Report Authenticated By: Lollie Marrow, M.D.    Brief H and P: For complete details please refer to admission H and P, but in brief This is an 78 y/o gentleman who was recently discharged from the hospital approximately a month ago after being treated for a pneumonia. He has a history of copd and was felt to probably have some chronic respiratory failure, and he was sent home on oxygen. He reports doing fairly well since his discharge and feels that his breathing is getting better. He apparently got up to go to the bathroom last night, and when he was going to sit on the toilet seat, his knee gave out causing him to fall. He was able to go back to bed, but when he woke up at 4am to go to the bathroom again, he described severe pain in his left hip. He is not clear on the details of the fall, but denies any loss of consciousness, head trauma, chest pain or shortness of breath. He had no dizziness. He was brought to the emergency room for evaluation and was found to have a left femoral neck fracture.   Hospital Course:  This gentleman was admitted to the hospital with hip fracture. It appears that he has sustained a mechanical fall. He was seen in consultation by Dr. Romeo Apple from orthopedics and underwent operative repair. Postoperatively the patient has been doing very well. His pain seems to be reasonably controlled and he is working with physical therapy. It is recommended that he be placed in a skilled nursing facility for short-term physical rehabilitation. He'll be continued on DVT prophylaxis with Lovenox for the next 30 days.  Patient was on oxygen prior to admission. He does have a history of COPD and was recently in hospital for pneumonia. He'll be continued on oxygen as an outpatient and this can be weaned off as felt appropriate. He is currently on 2 L and is maintaining his saturations.  Patient was also noted to have an acute  blood loss anemia postoperatively. We will repeat blood work in the morning to see if he's had a further decline. He may need transfusion of blood, but at this time he appears stable.  Patient is medically stable for transfer to a skilled facility pending orthopedic clearance. Once he is stable from an orthopedic standpoint we will transition to a skilled nursing facility for continued care. An addendum will be done to this discharged summary.  Time spent on Discharge:  Signed: MEMON,JEHANZEB [facility] Pager: [phone] 08/31/2011, 11:58 AM  Addendum: This patient has been stable since the above discharge summary was dictated. Patient's hemoglobin today is 12.0. He feels well. He does not have any dyspnea. Heart sounds are present and normal. Lung  fields are clear except for some reduced air entry in both bases. He is alert and orientated.  Plan: Patient is medically stable to be discharged to a skilled nursing facility for rehabilitation. He will need to follow with Dr. Romeo Apple, orthopedics, in a couple weeks.

## 2011-09-01 DIAGNOSIS — I10 Essential (primary) hypertension: Secondary | ICD-10-CM

## 2011-09-01 DIAGNOSIS — S72009A Fracture of unspecified part of neck of unspecified femur, initial encounter for closed fracture: Secondary | ICD-10-CM

## 2011-09-01 DIAGNOSIS — J438 Other emphysema: Secondary | ICD-10-CM

## 2011-09-01 DIAGNOSIS — J9610 Chronic respiratory failure, unspecified whether with hypoxia or hypercapnia: Secondary | ICD-10-CM

## 2011-09-01 LAB — BASIC METABOLIC PANEL
BUN: 6 mg/dL (ref 6–23)
CO2: 31 mEq/L (ref 19–32)
Calcium: 9.3 mg/dL (ref 8.4–10.5)
Glucose, Bld: 102 mg/dL — ABNORMAL HIGH (ref 70–99)
Sodium: 134 mEq/L — ABNORMAL LOW (ref 135–145)

## 2011-09-01 LAB — CBC
HCT: 35.6 % — ABNORMAL LOW (ref 39.0–52.0)
Hemoglobin: 12 g/dL — ABNORMAL LOW (ref 13.0–17.0)
MCH: 33.1 pg (ref 26.0–34.0)
MCV: 98.3 fL (ref 78.0–100.0)
RBC: 3.62 MIL/uL — ABNORMAL LOW (ref 4.22–5.81)

## 2011-09-01 MED ORDER — SENNOSIDES-DOCUSATE SODIUM 8.6-50 MG PO TABS: 1.0000 | ORAL_TABLET | Freq: Every evening | ORAL | Status: DC | PRN

## 2011-09-01 NOTE — Progress Notes (Signed)
Physical Therapy Treatment Patient Details Name: John Chambers MRN: [id number] DOB: 1926-10-17 Today's Date: 09/01/2011 Time: 9562-1308 PT Time Calculation (min): 32 min  PT Assessment / Plan / Recommendation Comments on Treatment Session  Pt c/o 6/10 left hip pain but able to manage it well and complete all exercises and gait with little difficulty. Patient did feel woozy at halfway point (19') of gait training and needed to turn around ;however was able  finish ambulating to chair without LOB. O2 sats with no oxygen at completetion of gait were 93%    Follow Up Recommendations       Barriers to Discharge        Equipment Recommendations       Recommendations for Other Services    Frequency     Plan      Precautions / Restrictions     Pertinent Vitals/Pain 6/10 pain but patient managing well    Mobility  Bed Mobility Supine to Sit: 6: Modified independent (Device/Increase time) Sitting - Scoot to Edge of Bed: 6: Modified independent (Device/Increase time) Transfers Sit to Stand: 4: Min guard Stand to Sit: 5: Supervision Details for Transfer Assistance: verbal cues needed for hand placement and guarding for standing due to unsteadiness Ambulation/Gait Ambulation Distance (Feet): 38 Feet Assistive device: Rolling walker Ambulation/Gait Assistance Details: verbal cues for thoracic extension  Stairs: No Wheelchair Mobility Wheelchair Mobility: No    Exercises General Exercises - Upper Extremity Shoulder Horizontal ABduction: Both;10 reps Shoulder Horizontal ADduction: 10 reps;Both General Exercises - Lower Extremity Ankle Circles/Pumps: 20 reps;Both Quad Sets: 10 reps;Both Gluteal Sets: 10 reps Short Arc Quad: 10 reps;Both Long Arc Quad: Both;15 reps Heel Slides: Both;10 reps Hip ABduction/ADduction: Both;10 reps   PT Diagnosis:    PT Problem List:   PT Treatment Interventions:     PT Goals Acute Rehab PT Goals PT Goal: Supine/Side to Sit - Progress:  Progressing toward goal PT Goal: Sit to Supine/Side - Progress: Progressing toward goal PT Goal: Sit to Stand - Progress: Progressing toward goal PT Goal: Stand to Sit - Progress: Progressing toward goal PT Goal: Ambulate - Progress: Progressing toward goal  Visit Information  Last PT Received On: 09/01/11    Subjective Data      Cognition       Balance     End of Session PT - End of Session Equipment Utilized During Treatment: Gait belt Activity Tolerance: Patient tolerated treatment well Patient left: in chair;with call bell/phone within reach;with chair alarm set;with family/visitor present    Mattia Osterman ATKINSO 09/01/2011, 9:57 AM

## 2011-09-01 NOTE — Progress Notes (Signed)
Patient ID: John Chambers, male   DOB: 1926/07/25, 78 y.o.   MRN: [id number] This is postoperative day #2 after left hip pinning  BP 136/91  Pulse 91  Temp(Src) 98.3 F (36.8 C) (Oral)  Resp 20  Ht 5\' 8"  (1.727 m)  Wt 143 lb 4.8 oz (65 kg)  BMI 21.79 kg/m2  SpO2 97%  Hemoglobin is stable  He has been discharged  He is weightbearing as tolerated  DVT x 30 days with lovenox Staples out POD 14  F/U POD 14

## 2011-09-01 NOTE — Anesthesia Postprocedure Evaluation (Signed)
  Anesthesia Post-op Note  Patient: [name]  Procedure(s) Performed: Procedure(s) (LRB): CANNULATED HIP PINNING (Left)  Patient Location: Room 309  Anesthesia Type: Spinal  Level of Consciousness: awake, alert , oriented and patient cooperative  Airway and Oxygen Therapy: Patient Spontanous Breathing  Post-op Pain: mild  Post-op Assessment: Post-op Vital signs reviewed, Patient's Cardiovascular Status Stable, Respiratory Function Stable, Patent Airway, No signs of Nausea or vomiting and Pain level controlled  Post-op Vital Signs: Reviewed and stable  Complications: No apparent anesthesia complications

## 2011-09-01 NOTE — Clinical Social Work Note (Signed)
Patient discharged by MD to [facility].  Patient and facility aware and agreeable.  Patient's granddaughter notified by voice mail.  Discharge summary faxed to facility.  Patient to transfer with RN.    Clovis Cao Clinical Social Worker [phone])

## 2011-09-01 NOTE — Progress Notes (Signed)
Report called to [facility].  IV dc'd pt tolerated well.  Pt tranferred to [facility] for rehab.

## 2011-09-01 NOTE — Addendum Note (Signed)
Addendum  created 09/01/11 0907 by Marolyn Hammock, CRNA   Modules edited:Notes Section

## 2011-09-02 ENCOUNTER — Encounter (HOSPITAL_COMMUNITY): Payer: Self-pay | Admitting: Orthopedic Surgery

## 2011-09-02 ENCOUNTER — Telehealth: Payer: Self-pay | Admitting: Orthopedic Surgery

## 2011-09-02 NOTE — Telephone Encounter (Signed)
[facility] nurse, Aaron Mose, called to relay that she has seen patient (states he was discharged to home yesterday, 09/01/11), and she said he has not had a bowel movement since Sunday, 08/28/11.     He lives in [place] and his pharmacy is [facility], at ph# [phone].  Patient phone# is [phone].   I called back to [facility] and spoke with Rexford Maus, clinic supervisor, to ask her, due to Dr. Romeo Apple in surgery all day today, as well as most of Monday, that they may also want to contact patient's primary care doctor, for more immediate attention to this question.  She states she will try this as well.

## 2011-09-03 LAB — TYPE AND SCREEN
ABO/RH(D): A POS
Antibody Screen: NEGATIVE
Unit division: 0
Unit division: 0

## 2011-09-14 ENCOUNTER — Ambulatory Visit (INDEPENDENT_AMBULATORY_CARE_PROVIDER_SITE_OTHER): Payer: Medicare Other | Admitting: Orthopedic Surgery

## 2011-09-14 ENCOUNTER — Encounter: Payer: Self-pay | Admitting: Orthopedic Surgery

## 2011-09-14 VITALS — BP 90/58 | Ht 68.0 in | Wt 143.0 lb

## 2011-09-14 DIAGNOSIS — M719 Bursopathy, unspecified: Secondary | ICD-10-CM | POA: Insufficient documentation

## 2011-09-14 DIAGNOSIS — S72009A Fracture of unspecified part of neck of unspecified femur, initial encounter for closed fracture: Secondary | ICD-10-CM

## 2011-09-14 DIAGNOSIS — M67919 Unspecified disorder of synovium and tendon, unspecified shoulder: Secondary | ICD-10-CM | POA: Insufficient documentation

## 2011-09-14 NOTE — Progress Notes (Signed)
Patient ID: John Chambers, male   DOB: 1927-02-20, 78 y.o.   MRN: [id number] Chief Complaint  Patient presents with  . Follow-up    2 week follow up hip surgery   BP 90/58  Ht 5\' 8"  (1.727 m)  Wt 143 lb (64.864 kg)  BMI 21.74 kg/m2  History his LEFT hip May 14  Doing well from his hip therapy at home about to finish that.  Complains of LEFT shoulder pain. I merely since he started using his walker more.  Painful forward elevation of the LEFT shoulder.  Denies numbness or tingling. LEFT upper extremity.  No palpation tenderness. No swelling. Passive range of motion is normal with crepitance in the subacromial space. The shoulder is stable. Strength of the rotator cuff was normal. Skins intact. He has good pulse in that arm.  Impression positive impingement sign.  Diagnosis impingement syndrome, LEFT shoulder.  Secondary diagnosis LEFT femoral neck fracture, status post internal fixation.  Inject LEFT subacromial space.  Start outpatient physical therapy.  Follow up in 4 weeks for x-ray, LEFT hip, AP and frog lateral  Subacromial Shoulder Injection Procedure Note  Pre-operative Diagnosis: left RC Syndrome  Post-operative Diagnosis: same  Indications: pain   Anesthesia: ethyl chloride   Procedure Details   Verbal consent was obtained for the procedure. The shoulder was prepped withalcohol and the skin was anesthetized. A 20 gauge needle was advanced into the subacromial space through posterior approach without difficulty  The space was then injected with 3 ml 1% lidocaine and 1 ml of depomedrol. The injection site was cleansed with isopropyl alcohol and a dressing was applied.  Complications:  None; patient tolerated the procedure well.

## 2011-09-14 NOTE — Patient Instructions (Addendum)
Start Outpatient therapy   You have received a steroid shot. 15% of patients experience increased pain at the injection site with in the next 24 hours. This is best treated with ice and tylenol extra strength 2 tabs every 8 hours. If you are still having pain please call the office.

## 2011-09-15 ENCOUNTER — Telehealth: Payer: Self-pay | Admitting: Orthopedic Surgery

## 2011-09-15 NOTE — Telephone Encounter (Signed)
NO ENCOUNTER COULD NOT DELETE

## 2011-10-12 ENCOUNTER — Ambulatory Visit (INDEPENDENT_AMBULATORY_CARE_PROVIDER_SITE_OTHER): Payer: Medicare Other

## 2011-10-12 ENCOUNTER — Encounter: Payer: Self-pay | Admitting: Orthopedic Surgery

## 2011-10-12 ENCOUNTER — Ambulatory Visit (INDEPENDENT_AMBULATORY_CARE_PROVIDER_SITE_OTHER): Payer: Medicare Other | Admitting: Orthopedic Surgery

## 2011-10-12 VITALS — BP 124/70 | Ht 68.0 in | Wt 150.0 lb

## 2011-10-12 DIAGNOSIS — M25559 Pain in unspecified hip: Secondary | ICD-10-CM

## 2011-10-12 NOTE — Progress Notes (Signed)
Patient ID: John Chambers, male   DOB: [birth year], 78 y.o.   MRN: [id number] Chief Complaint  Patient presents with  . Follow-up    4 week recheck and xrays left hip    BP 124/70  Ht 5\' 8"  (1.727 m)  Wt 150 lb (68.04 kg)  BMI 22.81 kg/m2  Status post LEFT hip fracture. Date of surgery May 14  Procedure history his LEFT hip.  Patient is doing well. No pain progressing with therapy well using his cane.  X-rays show fracture healing appropriately. Hardware in perfect position.  Come back in 6 weeks for repeat x-ray, AP and lateral LEFT hip

## 2011-10-12 NOTE — Patient Instructions (Addendum)
Activities as tolerated with cane

## 2011-10-28 ENCOUNTER — Emergency Department (HOSPITAL_COMMUNITY)
Admission: EM | Admit: 2011-10-28 | Discharge: 2011-10-28 | Disposition: A | Discharge status: Home / Self Care | Payer: Medicare Other | Attending: Emergency Medicine | Admitting: Emergency Medicine

## 2011-10-28 ENCOUNTER — Encounter (HOSPITAL_COMMUNITY): Payer: Self-pay

## 2011-10-28 DIAGNOSIS — X131XXA Other contact with steam and other hot vapors, initial encounter: Secondary | ICD-10-CM | POA: Insufficient documentation

## 2011-10-28 DIAGNOSIS — E7800 Pure hypercholesterolemia, unspecified: Secondary | ICD-10-CM | POA: Insufficient documentation

## 2011-10-28 DIAGNOSIS — I10 Essential (primary) hypertension: Secondary | ICD-10-CM | POA: Insufficient documentation

## 2011-10-28 DIAGNOSIS — T24209A Burn of second degree of unspecified site of unspecified lower limb, except ankle and foot, initial encounter: Secondary | ICD-10-CM

## 2011-10-28 DIAGNOSIS — K219 Gastro-esophageal reflux disease without esophagitis: Secondary | ICD-10-CM | POA: Insufficient documentation

## 2011-10-28 DIAGNOSIS — Y93G9 Activity, other involving cooking and grilling: Secondary | ICD-10-CM | POA: Insufficient documentation

## 2011-10-28 DIAGNOSIS — N400 Enlarged prostate without lower urinary tract symptoms: Secondary | ICD-10-CM | POA: Insufficient documentation

## 2011-10-28 DIAGNOSIS — J4489 Other specified chronic obstructive pulmonary disease: Secondary | ICD-10-CM | POA: Insufficient documentation

## 2011-10-28 DIAGNOSIS — D649 Anemia, unspecified: Secondary | ICD-10-CM | POA: Insufficient documentation

## 2011-10-28 DIAGNOSIS — X12XXXA Contact with other hot fluids, initial encounter: Secondary | ICD-10-CM | POA: Insufficient documentation

## 2011-10-28 DIAGNOSIS — Y998 Other external cause status: Secondary | ICD-10-CM | POA: Insufficient documentation

## 2011-10-28 DIAGNOSIS — T24219A Burn of second degree of unspecified thigh, initial encounter: Secondary | ICD-10-CM | POA: Insufficient documentation

## 2011-10-28 DIAGNOSIS — T310 Burns involving less than 10% of body surface: Secondary | ICD-10-CM | POA: Insufficient documentation

## 2011-10-28 DIAGNOSIS — Z87891 Personal history of nicotine dependence: Secondary | ICD-10-CM | POA: Insufficient documentation

## 2011-10-28 DIAGNOSIS — J449 Chronic obstructive pulmonary disease, unspecified: Secondary | ICD-10-CM | POA: Insufficient documentation

## 2011-10-28 DIAGNOSIS — Y92009 Unspecified place in unspecified non-institutional (private) residence as the place of occurrence of the external cause: Secondary | ICD-10-CM | POA: Insufficient documentation

## 2011-10-28 DIAGNOSIS — G4700 Insomnia, unspecified: Secondary | ICD-10-CM | POA: Insufficient documentation

## 2011-10-28 MED ORDER — TETANUS-DIPHTHERIA TOXOIDS TD 5-2 LFU IM INJ
0.5000 mL | INJECTION | Freq: Once | INTRAMUSCULAR | Status: AC
  Administered 2011-10-28: 0.5 mL via INTRAMUSCULAR
  Filled 2011-10-28: qty 0.5

## 2011-10-28 MED ORDER — DOXYCYCLINE HYCLATE 100 MG PO TABS
100.0000 mg | ORAL_TABLET | Freq: Once | ORAL | Status: AC
  Administered 2011-10-28: 100 mg via ORAL
  Filled 2011-10-28: qty 1

## 2011-10-28 MED ORDER — DOXYCYCLINE HYCLATE 100 MG PO CAPS: 100.0000 mg | ORAL_CAPSULE | Freq: Two times a day (BID) | ORAL | Status: AC

## 2011-10-28 MED ORDER — SILVER SULFADIAZINE 1 % EX CREA: TOPICAL_CREAM | Freq: Two times a day (BID) | CUTANEOUS | Status: DC

## 2011-10-28 MED ORDER — SILVER SULFADIAZINE 1 % EX CREA
TOPICAL_CREAM | Freq: Once | CUTANEOUS | Status: AC
  Administered 2011-10-28: 1 via TOPICAL
  Filled 2011-10-28: qty 50
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
## 2011-10-28 NOTE — ED Notes (Signed)
Patient with no complaints at this time. Respirations even and unlabored. Skin warm/dry. Discharge instructions reviewed with patient at this time. Patient given opportunity to voice concerns/ask questions. Patient discharged at this time and left Emergency Department with steady gait.   
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
## 2011-11-23 ENCOUNTER — Ambulatory Visit: Payer: Medicare Other | Admitting: Orthopedic Surgery

## 2011-11-30 ENCOUNTER — Ambulatory Visit (INDEPENDENT_AMBULATORY_CARE_PROVIDER_SITE_OTHER): Payer: Medicare Other

## 2011-11-30 ENCOUNTER — Ambulatory Visit (INDEPENDENT_AMBULATORY_CARE_PROVIDER_SITE_OTHER): Payer: Medicare Other | Admitting: Orthopedic Surgery

## 2011-11-30 ENCOUNTER — Encounter: Payer: Self-pay | Admitting: Orthopedic Surgery

## 2011-11-30 VITALS — BP 120/80 | Ht 70.0 in | Wt 152.0 lb

## 2011-11-30 DIAGNOSIS — S72009A Fracture of unspecified part of neck of unspecified femur, initial encounter for closed fracture: Secondary | ICD-10-CM

## 2011-11-30 DIAGNOSIS — S72002A Fracture of unspecified part of neck of left femur, initial encounter for closed fracture: Secondary | ICD-10-CM

## 2011-11-30 NOTE — Progress Notes (Signed)
Patient ID: John Chambers, male   DOB: 1926-06-06, 78 y.o.   MRN: [id number] Chief Complaint  Patient presents with  . Follow-up    6 week recheck and xray Left hip, DOS 08/30/11   Postop visit status post internal fixation left hip with cannulated screws. Patient has no pain doing well full weightbearing no assist devices x-rays are excellent fracture healed hardware is intact clinical exam is normal  Follow up as needed

## 2011-11-30 NOTE — Patient Instructions (Addendum)
activities as tolerated 

## 2011-12-01 ENCOUNTER — Telehealth: Payer: Self-pay | Admitting: Orthopedic Surgery

## 2011-12-01 NOTE — Telephone Encounter (Signed)
Faxed order for hospital bed per patient and per [facility] request, for use of hospital bed at home following hospital discharge.  Faxed to # [phone].  Patient aware.

## 2011-12-02 ENCOUNTER — Telehealth: Payer: Self-pay | Admitting: Orthopedic Surgery

## 2011-12-02 NOTE — Telephone Encounter (Signed)
Darie, [facility] nurse, called back in response to Rx/order for hospital bed; states needs additional documentation to support patient's need for hospital bed, at time of discharge from hospital for fracture of hip (post op surgery 08/31/11).  States that order would need, per Medicare requirement, reasons such as chronic pain, or needs positioned frequently, or type of elevation for leg or feet.  Please advise.  [facility] ph# [phone], fax# [phone].

## 2011-12-08 ENCOUNTER — Encounter: Payer: Self-pay | Admitting: Orthopedic Surgery
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
## 2011-12-09 NOTE — Telephone Encounter (Signed)
Done, on 12/08/11, and faxed back to [facility], [phone].

## 2011-12-29 ENCOUNTER — Encounter (HOSPITAL_COMMUNITY): Payer: Self-pay

## 2011-12-29 ENCOUNTER — Emergency Department (HOSPITAL_COMMUNITY)
Admission: EM | Admit: 2011-12-29 | Discharge: 2011-12-29 | Disposition: A | Discharge status: Home / Self Care | Payer: Medicare Other | Attending: Emergency Medicine | Admitting: Emergency Medicine

## 2011-12-29 ENCOUNTER — Emergency Department (HOSPITAL_COMMUNITY): Payer: Medicare Other

## 2011-12-29 DIAGNOSIS — S40019A Contusion of unspecified shoulder, initial encounter: Secondary | ICD-10-CM | POA: Insufficient documentation

## 2011-12-29 DIAGNOSIS — K219 Gastro-esophageal reflux disease without esophagitis: Secondary | ICD-10-CM | POA: Insufficient documentation

## 2011-12-29 DIAGNOSIS — E7800 Pure hypercholesterolemia, unspecified: Secondary | ICD-10-CM | POA: Insufficient documentation

## 2011-12-29 DIAGNOSIS — D649 Anemia, unspecified: Secondary | ICD-10-CM | POA: Insufficient documentation

## 2011-12-29 DIAGNOSIS — W1809XA Striking against other object with subsequent fall, initial encounter: Secondary | ICD-10-CM | POA: Insufficient documentation

## 2011-12-29 DIAGNOSIS — J449 Chronic obstructive pulmonary disease, unspecified: Secondary | ICD-10-CM | POA: Insufficient documentation

## 2011-12-29 DIAGNOSIS — I10 Essential (primary) hypertension: Secondary | ICD-10-CM | POA: Insufficient documentation

## 2011-12-29 DIAGNOSIS — N400 Enlarged prostate without lower urinary tract symptoms: Secondary | ICD-10-CM | POA: Insufficient documentation

## 2011-12-29 DIAGNOSIS — Z87891 Personal history of nicotine dependence: Secondary | ICD-10-CM | POA: Insufficient documentation

## 2011-12-29 DIAGNOSIS — S40011A Contusion of right shoulder, initial encounter: Secondary | ICD-10-CM

## 2011-12-29 DIAGNOSIS — G4700 Insomnia, unspecified: Secondary | ICD-10-CM | POA: Insufficient documentation

## 2011-12-29 DIAGNOSIS — J4489 Other specified chronic obstructive pulmonary disease: Secondary | ICD-10-CM | POA: Insufficient documentation
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
## 2011-12-29 NOTE — ED Notes (Signed)
Pt c/o right shoulder pain from fall 8 days prior. Shoulder bruised hurts with movement.

## 2012-01-05 NOTE — ED Provider Notes (Signed)
Medical screening examination/treatment/procedure(s) were performed by non-physician practitioner and as supervising physician I was immediately available for consultation/collaboration.   Zavior Thomason L Romell Wolden, MD 01/05/12 1522 

## 2013-04-26 IMAGING — RF DG HIP OPERATIVE*L*
1 series · 4 of 4 positions shown · non-contrast
Comparison: 08/29/2011

CLINICAL DATA: Left hip fracture

OPERATIVE LEFT HIP

[Series 1: run · 4 of 4 slices shown]
[im 1/4]
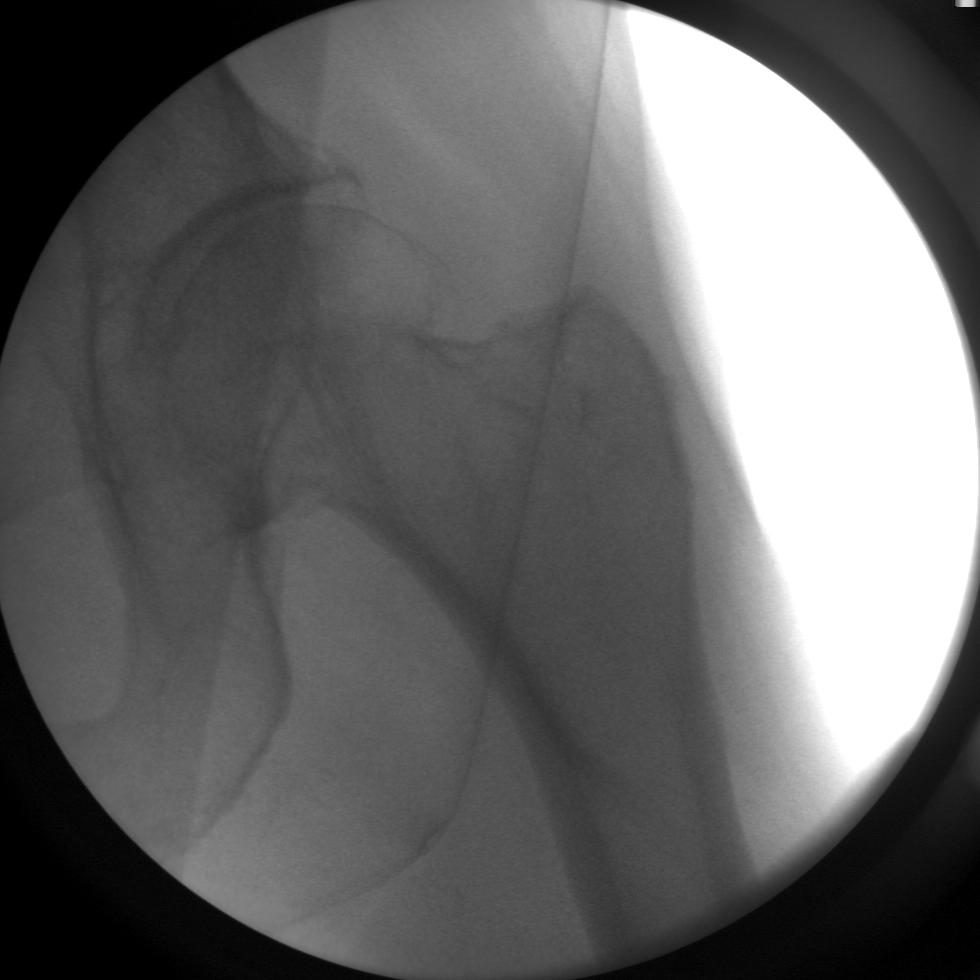
[im 2/4]
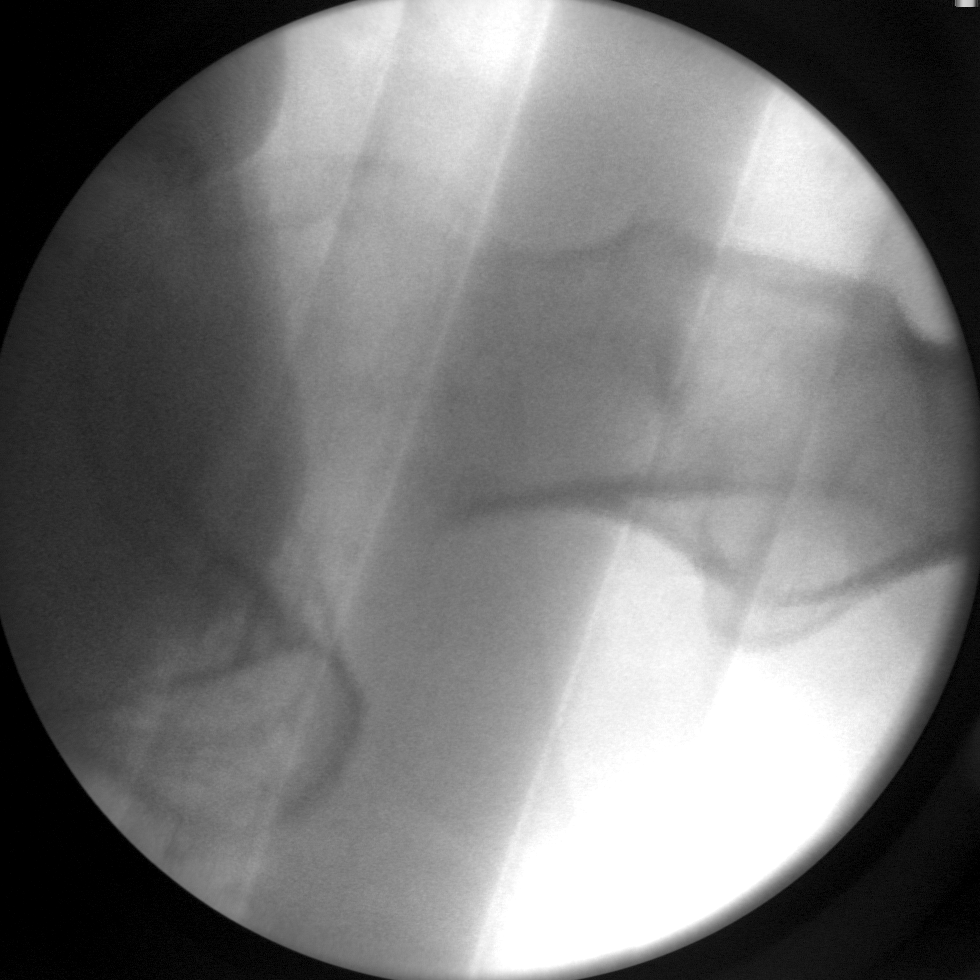
[im 3/4]
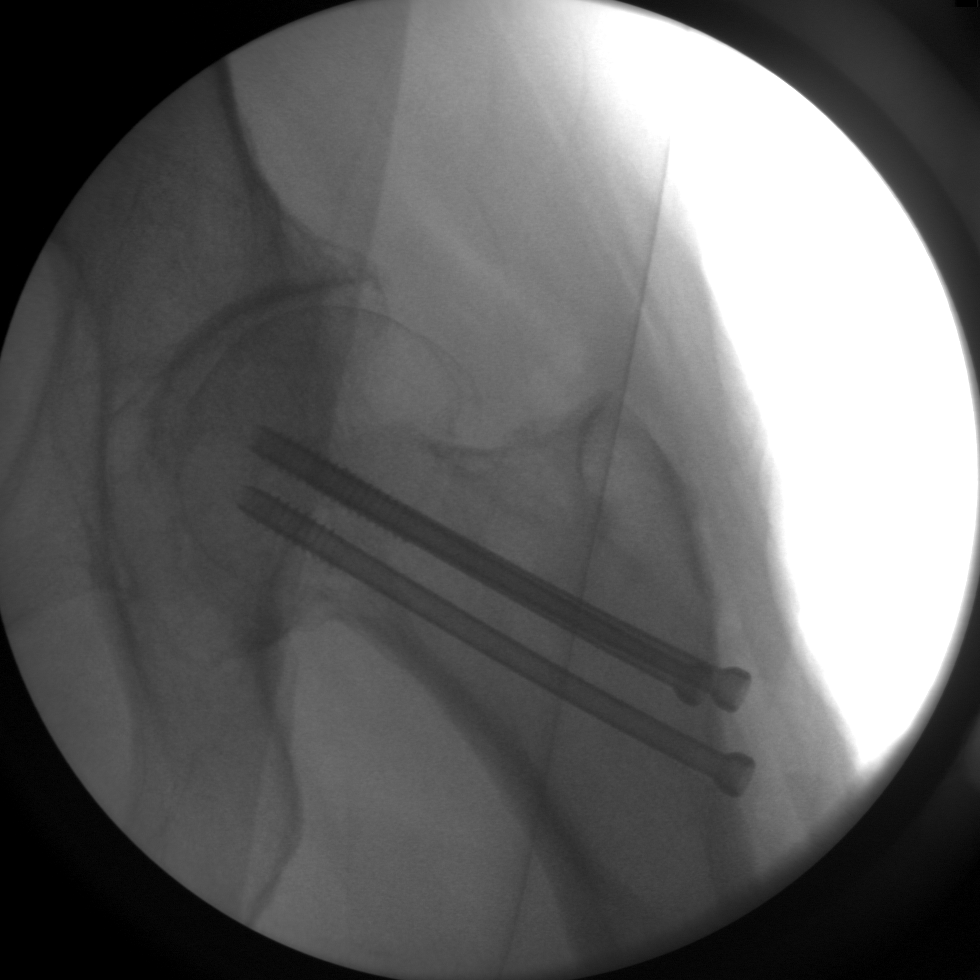
[im 4/4]
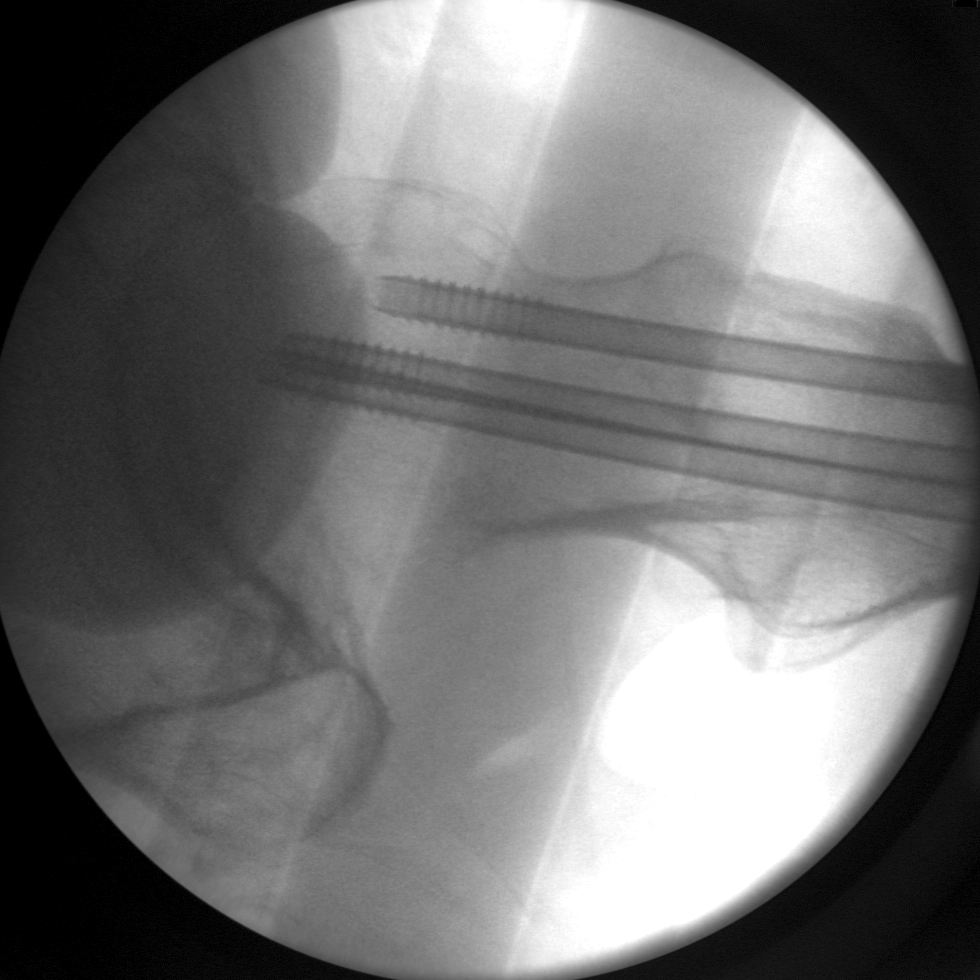

[4 of 4 positions shown; findings below may reference images not displayed]

FINDINGS: C-arm documents placement of surgical pins across a
slightly impacted subcapital left femoral neck fracture.  No
adverse features.
IMPRESSION: As above.

## 2013-08-04 ENCOUNTER — Encounter (HOSPITAL_COMMUNITY): Payer: Self-pay | Admitting: Emergency Medicine

## 2013-08-04 ENCOUNTER — Emergency Department (HOSPITAL_COMMUNITY): Payer: Medicare Other

## 2013-08-04 ENCOUNTER — Emergency Department (HOSPITAL_COMMUNITY)
Admission: EM | Admit: 2013-08-04 | Discharge: 2013-08-04 | Disposition: A | Discharge status: Home / Self Care | Payer: Medicare Other | Attending: Emergency Medicine | Admitting: Emergency Medicine

## 2013-08-04 DIAGNOSIS — Z880 Allergy status to penicillin: Secondary | ICD-10-CM | POA: Insufficient documentation

## 2013-08-04 DIAGNOSIS — I10 Essential (primary) hypertension: Secondary | ICD-10-CM | POA: Insufficient documentation

## 2013-08-04 DIAGNOSIS — Z79899 Other long term (current) drug therapy: Secondary | ICD-10-CM | POA: Insufficient documentation

## 2013-08-04 DIAGNOSIS — Z87891 Personal history of nicotine dependence: Secondary | ICD-10-CM | POA: Insufficient documentation

## 2013-08-04 DIAGNOSIS — M25569 Pain in unspecified knee: Secondary | ICD-10-CM | POA: Insufficient documentation

## 2013-08-04 DIAGNOSIS — Z862 Personal history of diseases of the blood and blood-forming organs and certain disorders involving the immune mechanism: Secondary | ICD-10-CM | POA: Insufficient documentation

## 2013-08-04 DIAGNOSIS — Z8719 Personal history of other diseases of the digestive system: Secondary | ICD-10-CM | POA: Insufficient documentation

## 2013-08-04 DIAGNOSIS — J449 Chronic obstructive pulmonary disease, unspecified: Secondary | ICD-10-CM | POA: Insufficient documentation

## 2013-08-04 DIAGNOSIS — E7800 Pure hypercholesterolemia, unspecified: Secondary | ICD-10-CM | POA: Insufficient documentation

## 2013-08-04 DIAGNOSIS — M87059 Idiopathic aseptic necrosis of unspecified femur: Secondary | ICD-10-CM | POA: Insufficient documentation

## 2013-08-04 DIAGNOSIS — M87051 Idiopathic aseptic necrosis of right femur: Secondary | ICD-10-CM

## 2013-08-04 DIAGNOSIS — Z87448 Personal history of other diseases of urinary system: Secondary | ICD-10-CM | POA: Insufficient documentation

## 2013-08-04 DIAGNOSIS — J4489 Other specified chronic obstructive pulmonary disease: Secondary | ICD-10-CM | POA: Insufficient documentation

## 2013-08-04 DIAGNOSIS — Z9889 Other specified postprocedural states: Secondary | ICD-10-CM | POA: Insufficient documentation

## 2013-08-04 DIAGNOSIS — G8921 Chronic pain due to trauma: Secondary | ICD-10-CM | POA: Insufficient documentation

## 2013-08-04 NOTE — Discharge Instructions (Signed)
Avascular Necrosis  Avascular necrosis is a disease resulting from the temporary or permanent loss of the blood supply to the bones. Without blood, the bone tissue dies and causes the bone to become soft. If the process involves the bone near a joint, it may lead to collapse of the joint surface. This disease is also known as:   Osteonecrosis.   Aseptic necrosis.   Ischemic bone necrosis.  Avascular necrosis most commonly affects the ends (epiphysis) of long bones. The femur, the bone extending from the knee joint to the hip joint, is the bone most commonly involved. The disease may affect 1 bone, more than 1 bone at the same time, more than 1 bone at different times. It affects men and women equally. Avascular necrosis occurs at any age. But it is more common between the ages of 30 and 50 years.  SYMPTOMS   In early stages patients may not have any symptoms. But as the disease progresses, joint pain generally develops. At first there is pain when putting weight on the affected joint, and then when resting. Pain usually develops gradually. It may be mild or severe. As the disease progresses and the bone and surrounding joint surface collapses, pain may develop or increase dramatically. Pain may be severe enough to limit range of motion in the affected joint. The period of time between the first symptoms and loss of joint function is different for each patient. This can range from several months to more than a year. Disability depends on:   What part of the bone is affected.   How large an area is involved.   How effectively the bone repairs itself.   If other illnesses are present.   If you are being treated for cancer with medications (chemotherapy).   Radiation.   The cause of the avascular necrosis.  DIAGNOSIS   The diagnosis of aseptic necrosis is usually made by:   Taking a history.   Doing an exam.   Taking X-rays. (If X-rays are normal, an MRI may be required.)    Sometimes further blood work and specialized studies may be necessary.  TREATMENT   Treatment for this disease is necessary to maintain joint function. If untreated, most patients will suffer severe pain and limitation in movement within 2 years. Several treatments are available that help prevent further bone and joint damage. They can also reduce pain. To determine the most appropriate treatment, the caregiver considers the following aspects of a patient's disease:   The age of the patient.   The stage of the disease (early or late).   The location and amount of bone affected. It may be a small or large area.   The underlying cause of avascular necrosis.  The goals in treatment are to:   Improve the patient's use of the affected joint.   Stop further damage to the bone.   Improve bone and joint survival.  Your caregiver may use one or more of the following treatments:   Reduced weight bearing. If avascular necrosis is diagnosed early, the caregiver may begin treatment by having the patient limit weight on the affected joint. The caregiver may recommend limiting activities or using crutches. In some cases, reduced weight bearing can slow the damage caused by the disease and permit natural healing. When combined with medication to reduce pain, reduced weight bearing can be an effective way to avoid or delay surgery for some patients. Most patients eventually will need surgery to reconstruct the joint.     Core decompression. Core decompression works best in people who are in the earliest stages of avascular necrosis, before the collapse of the joint. This procedure often can reduce pain and slow the progression of bone and joint destruction in these patients. This surgical procedure removes the inner layer of bone, which:   Reduces pressure within the bone.   Increases blood flow to the bone.   Allows more blood vessels to form.   Reduces pain.    Osteotomy. This surgical procedure re-shapes the bone to reduce stress on the affected area of the joint. There is a lengthy recovery period. The patient's activities are very limited for 3 to 12 months after an osteotomy. This procedure is most effective for younger patients with advanced avascular necrosis, and those with a large area of affected bone.   Bone Graft. A bone graft may be used to support a joint after core decompression. Bone grafting is surgery that transplants healthy bone from one part of the patient, such as the leg, to the diseased area. Sometimes the bone is taken with it's blood vessels which are attached to local blood vessels near the area of bone collapse. This is called a vascularized bone graft. There is a lengthy recovery period after a bone graft, usually from 6 to 12 months. This procedure is technically complex.   Arthroplasty. Arthroplasty is also known as total joint replacement. Total joint replacement is used in late-stage avascular necrosis, and when the joint is deformed. In this surgery, the diseased joint is replaced with artificial parts. It may be recommended for people who are not good candidates for other treatments, such as patients who may not do well with repeated attempts to preserve the joint. Various types of replacements are available, and patients should discuss specific needs with their caregiver.  New treatments being tried include:   The use of medications.   Electrical stimulation.   Combination therapies to increase the growth of new bone and blood vessels.  Document Released: 09/24/2001 Document Revised: 06/27/2011 Document Reviewed: 11/25/2008  [employer] Patient Information 2014 [employer].

## 2013-08-04 NOTE — ED Notes (Signed)
Patient reports hip pain x several months with c/o his hip "giving out" while ambulating. States fall several months ago and has had pain since. [facility]

## 2013-08-04 NOTE — ED Notes (Signed)
Patient with no complaints at this time. Respirations even and unlabored. Skin warm/dry. Discharge instructions reviewed with patient at this time. Patient given opportunity to voice concerns/ask questions. Patient discharged at this time and left Emergency Department with steady gait.   

## 2013-08-04 NOTE — ED Provider Notes (Signed)
CSN: [id number]     Arrival date & time 08/04/13  1144 History  This chart was scribed for Flint MelterElliott L Dray Dente, MD by Quintella ReichertMatthew Underwood, ED scribe.  This patient was seen in room [facility]16A/[facility]A and the patient's care was started at 12:16 PM.   Chief Complaint  Patient presents with  . Hip Pain     The history is provided by the patient. No language interpreter was used.   HPI Comments: John Chambers is a 80 y.o. male who presents to the Emergency Department complaining of several months of right hip pain.  Pt states he slipped and fell on January 1st and was initially diagnosed with a hip fracture based on x-ray.  He was hospitalized for possible surgery, but he also received a CT-scan and after 3 days he states he was told that his hip was actually not fractured based on CT so he did not receive surgery.  He states he has continued to have intermittent right hip pain since then.  He feels that the damp weather worsens his pain.  He also reports his hip is "giving away on me" and he is unable to walk without a walker.  He denies buttock pain, back pain, or leg pain except for his chronic right knee pain.  He has attempted to treat pain with meloxicam, with some relief.   Past Medical History  Diagnosis Date  . Hypertension   . Hypercholesterolemia   . GERD (gastroesophageal reflux disease)   . COPD (chronic obstructive pulmonary disease)   . Chronic insomnia   . BPH (benign prostatic hyperplasia)   . Anemia 08/06/2011    Past Surgical History  Procedure Laterality Date  . Colonoscopy  03/31/10    Dr. Darrick PennaFields  . Right knee      x3  . Nose surgery    . Hip pinning,cannulated  08/30/2011    Procedure: CANNULATED HIP PINNING;  Surgeon: Vickki HearingStanley E Harrison, MD;  Location: [facility];  Service: Orthopedics;  Laterality: Left;    No family history on file.   History  Substance Use Topics  . Smoking status: Former Smoker -- 0.25 packs/day for 55 years    Types: Cigarettes  . Smokeless tobacco:  Not on file  . Alcohol Use: 1.2 oz/week    2 Shots of liquor per week     Review of Systems  All other systems reviewed and are negative.     Allergies  Milk-related compounds; Aspirin; and Penicillins  Home Medications   Prior to Admission medications   Medication Sig Start Date End Date Taking? Authorizing Provider  acetaminophen (TYLENOL) 500 MG tablet Take 500-1,000 mg by mouth every 6 (six) hours as needed. Pain    Historical Provider, MD  atorvastatin (LIPITOR) 10 MG tablet Take 10 mg by mouth daily.      Historical Provider, MD  Fluticasone-Salmeterol (ADVAIR DISKUS) 250-50 MCG/DOSE AEPB Inhale 1 puff into the lungs every 12 (twelve) hours as needed. For shortness of breath    Historical Provider, MD  hydroxypropyl methylcellulose (ISOPTO TEARS) 2.5 % ophthalmic solution Place 1 drop into both eyes daily as needed. Dry Eyes     Historical Provider, MD  lactase (LACTAID) 3000 UNITS tablet Take 1 tablet by mouth daily as needed. Lactose Intolerance    Historical Provider, MD  lisinopril-hydrochlorothiazide (PRINZIDE,ZESTORETIC) 10-12.5 MG per tablet Take 1 tablet by mouth daily.      Historical Provider, MD  mirtazapine (REMERON) 15 MG tablet Take 15 mg by mouth at  bedtime.    Historical Provider, MD  QUEtiapine (SEROQUEL) 25 MG tablet Take 25 mg by mouth at bedtime.  11/19/11   Historical Provider, MD  Tamsulosin HCl (FLOMAX) 0.4 MG CAPS Take 0.4 mg by mouth daily.     Historical Provider, MD   BP 177/96  Pulse 111  Temp(Src) 98.5 F (36.9 C) (Oral)  Resp 17  Ht 5\' 10"  (1.778 m)  Wt 160 lb (72.576 kg)  BMI 22.96 kg/m2  SpO2 99%  Physical Exam  Nursing note and vitals reviewed. Constitutional: He is oriented to person, place, and time. He appears well-developed and well-nourished.  HENT:  Head: Normocephalic and atraumatic.  Right Ear: External ear normal.  Left Ear: External ear normal.  Eyes: Conjunctivae and EOM are normal. Pupils are equal, round, and reactive to  light.  Neck: Normal range of motion and phonation normal. Neck supple.  Cardiovascular: Normal rate, regular rhythm, normal heart sounds and intact distal pulses.   Pulmonary/Chest: Effort normal and breath sounds normal. No respiratory distress. He has no wheezes. He has no rales. He exhibits no bony tenderness.  Abdominal: Soft. There is no tenderness.  Musculoskeletal: Normal range of motion.  Full ROM of right hip without pain, both actively and passively  Neurological: He is alert and oriented to person, place, and time. No cranial nerve deficit or sensory deficit. He exhibits normal muscle tone. Coordination normal.  Skin: Skin is warm, dry and intact.  Psychiatric: He has a normal mood and affect. His behavior is normal. Judgment and thought content normal.    ED Course  Procedures (including critical care time)  DIAGNOSTIC STUDIES: Oxygen Saturation is 99% on room air, normal by my interpretation.    COORDINATION OF CARE: 12:28 PM-Discussed treatment plan which includes imaging with pt at bedside and pt agreed to plan.     Labs Review Labs Reviewed - No data to display  Imaging Review Dg Lumbar Spine Complete  08/04/2013   CLINICAL DATA:  Lumbar spine pain.  Right hip pain.  EXAM: LUMBAR SPINE - COMPLETE 4+ VIEW  COMPARISON:  None.  FINDINGS: Advanced lumbar spondylosis with multilevel disc space narrowing. No worrisome osseous lesions. Osteopenia. Vascular calcification. Lower lumbar facet arthropathy. Slight degenerative scoliosis convex right. Ileus.  IMPRESSION: Lumbar spondylosis.  No acute findings.   Electronically Signed   By: Davonna BellingJohn  Curnes M.D.   On: 08/04/2013 13:07   Dg Hip Bilateral W/pelvis  08/04/2013   CLINICAL DATA:  Bilateral hip pain.  EXAM: BILATERAL HIP WITH PELVIS - 4+ VIEW  COMPARISON:  Left hip radiographs on 11/30/2011  FINDINGS: Three fixation screws are seen transfixing an old fracture deformity of the left femoral neck. No evidence of acute fracture  or dislocation. Moderate left hip osteoarthritis demonstrated.  No evidence of acute right hip fracture or dislocation. Old fracture deformity of the right femoral neck is seen with chronic deformity and decreased size of the right femoral head likely due to chronic avascular necrosis. Moderate right hip osteoarthritis also demonstrated. Peripheral vascular calcification noted.  IMPRESSION: No acute findings.  Old bilateral femoral neck fracture deformities, with chronic avascular necrosis of the right femoral head. Bilateral hip osteoarthritis.   Electronically Signed   By: Myles RosenthalJohn  Stahl M.D.   On: 08/04/2013 13:09     EKG Interpretation None      MDM   Final diagnoses:  Avascular necrosis of right femoral head    Chronic hip pain, secondary to AVN right femoral head. No evidence for  fracture or septic joint. Metabolic instability, or suspected bacterial infection.  Nursing Notes Reviewed/ Care Coordinated Applicable Imaging Reviewed Interpretation of Laboratory Data incorporated into ED treatment  The patient appears reasonably screened and/or stabilized for discharge and I doubt any other medical condition or other [facility] requiring further screening, evaluation, or treatment in the ED at this time prior to discharge.  Plan: Home Medications- Usual; Home Treatments- rest; return here if the recommended treatment, does not improve the symptoms; Recommended follow up- Ortho f/u prn    I personally performed the services described in this documentation, which was scribed in my presence. The recorded information has been reviewed and is accurate.      Flint Melter, MD 08/04/13 [phone]

## 2014-06-14 ENCOUNTER — Emergency Department (HOSPITAL_COMMUNITY)
Admission: EM | Admit: 2014-06-14 | Discharge: 2014-06-14 | Disposition: A | Discharge status: Home / Self Care | Payer: Medicare Other | Attending: Emergency Medicine | Admitting: Emergency Medicine

## 2014-06-14 ENCOUNTER — Encounter (HOSPITAL_COMMUNITY): Payer: Self-pay

## 2014-06-14 DIAGNOSIS — E780 Pure hypercholesterolemia: Secondary | ICD-10-CM | POA: Insufficient documentation

## 2014-06-14 DIAGNOSIS — I10 Essential (primary) hypertension: Secondary | ICD-10-CM | POA: Diagnosis not present

## 2014-06-14 DIAGNOSIS — Z8719 Personal history of other diseases of the digestive system: Secondary | ICD-10-CM | POA: Diagnosis not present

## 2014-06-14 DIAGNOSIS — Z8669 Personal history of other diseases of the nervous system and sense organs: Secondary | ICD-10-CM | POA: Insufficient documentation

## 2014-06-14 DIAGNOSIS — J449 Chronic obstructive pulmonary disease, unspecified: Secondary | ICD-10-CM | POA: Diagnosis not present

## 2014-06-14 DIAGNOSIS — Z880 Allergy status to penicillin: Secondary | ICD-10-CM | POA: Diagnosis not present

## 2014-06-14 DIAGNOSIS — M25551 Pain in right hip: Secondary | ICD-10-CM | POA: Insufficient documentation

## 2014-06-14 DIAGNOSIS — Z87438 Personal history of other diseases of male genital organs: Secondary | ICD-10-CM | POA: Diagnosis not present

## 2014-06-14 DIAGNOSIS — Z79899 Other long term (current) drug therapy: Secondary | ICD-10-CM | POA: Insufficient documentation

## 2014-06-14 DIAGNOSIS — Z87891 Personal history of nicotine dependence: Secondary | ICD-10-CM | POA: Diagnosis not present

## 2014-06-14 DIAGNOSIS — Z862 Personal history of diseases of the blood and blood-forming organs and certain disorders involving the immune mechanism: Secondary | ICD-10-CM | POA: Insufficient documentation

## 2014-06-14 MED ORDER — OXYCODONE-ACETAMINOPHEN 5-325 MG PO TABS: 1.0000 | ORAL_TABLET | Freq: Three times a day (TID) | ORAL | Status: DC | PRN

## 2014-06-14 MED ORDER — OXYCODONE-ACETAMINOPHEN 5-325 MG PO TABS
1.0000 | ORAL_TABLET | Freq: Once | ORAL | Status: AC
  Administered 2014-06-14: 1 via ORAL
  Filled 2014-06-14: qty 1

## 2014-06-14 NOTE — Discharge Instructions (Signed)

## 2014-06-14 NOTE — ED Provider Notes (Signed)
CSN: [id number]     Arrival date & time 06/14/14  1155 History   First MD Initiated Contact with Patient 06/14/14 1310     Chief Complaint  Patient presents with  . Hip Pain     Patient is a 81 y.o. male presenting with hip pain. The history is provided by the patient.  Hip Pain This is a recurrent problem. The current episode started more than 2 days ago. The problem occurs daily. The problem has been gradually worsening. The symptoms are aggravated by walking. The symptoms are relieved by rest.  Patient presents for acute on chronic right hip pain The hip pain started a year ago He denies trauma He denies leg weakness He reports h/o pain in right hip due to "lack of blood supply" He reports the pain in his pain will "flare up" frequently and he is requesting "Cortisone shot" He has no pain meds at home   Past Medical History  Diagnosis Date  . Hypertension   . Hypercholesterolemia   . GERD (gastroesophageal reflux disease)   . COPD (chronic obstructive pulmonary disease)   . Chronic insomnia   . BPH (benign prostatic hyperplasia)   . Anemia 08/06/2011   Past Surgical History  Procedure Laterality Date  . Colonoscopy  03/31/10    Dr. Darrick PennaFields  . Right knee      x3  . Nose surgery    . Hip pinning,cannulated  08/30/2011    Procedure: CANNULATED HIP PINNING;  Surgeon: Vickki HearingStanley E Harrison, MD;  Location: [facility];  Service: Orthopedics;  Laterality: Left;   No family history on file. History  Substance Use Topics  . Smoking status: Former Smoker -- 0.25 packs/day for 55 years    Types: Cigarettes  . Smokeless tobacco: Not on file  . Alcohol Use: 1.2 oz/week    2 Shots of liquor per week    Review of Systems  Musculoskeletal: Negative for back pain and arthralgias.  Neurological: Negative for weakness.      Allergies  Milk-related compounds; Aspirin; and Penicillins  Home Medications   Prior to Admission medications   Medication Sig Start Date End Date Taking?  Authorizing Provider  acetaminophen (TYLENOL) 500 MG tablet Take 500-1,000 mg by mouth every 6 (six) hours as needed. Pain   Yes Historical Provider, MD  amLODipine (NORVASC) 5 MG tablet Take 5 mg by mouth daily.   Yes Historical Provider, MD  atorvastatin (LIPITOR) 10 MG tablet Take 10 mg by mouth daily.     Yes Historical Provider, MD  hydroxypropyl methylcellulose (ISOPTO TEARS) 2.5 % ophthalmic solution Place 1 drop into both eyes daily as needed for dry eyes.   Yes Historical Provider, MD  mirtazapine (REMERON) 15 MG tablet Take 7.5 mg by mouth at bedtime.    Yes Historical Provider, MD  Tamsulosin HCl (FLOMAX) 0.4 MG CAPS Take 0.4 mg by mouth daily.    Yes Historical Provider, MD  oxyCODONE-acetaminophen (PERCOCET/ROXICET) 5-325 MG per tablet Take 1 tablet by mouth every 8 (eight) hours as needed for severe pain. 06/14/14   Joya Gaskinsonald W Ausha Sieh, MD   BP 126/85 mmHg  Pulse 85  Temp(Src) 97.6 F (36.4 C) (Oral)  Resp 16  Ht 5\' 9"  (1.753 m)  Wt 155 lb (70.308 kg)  BMI 22.88 kg/m2  SpO2 94% Physical Exam CONSTITUTIONAL: elderly, no distress noted HEAD: Normocephalic/atraumatic ENMT: Mucous membranes moist NECK: supple no meningeal signs SPINE/BACK:entire spine nontender LUNGS: Lungs are clear to auscultation bilaterally, no apparent distress ABDOMEN: soft,  nontender, no rebound or guarding, bowel sounds noted throughout abdomen GU:no cva tenderness NEURO: Pt is awake/alert/appropriate, moves all extremitiesx4. He can ambulate with assistance (uses walker at home) EXTREMITIES: pulses normal/equal, full ROM.  Mild tenderness to ROM of right hip.  No deformity.  No erythema/warmth to right hip SKIN: warm, color normal PSYCH: no abnormalities of mood noted, alert and oriented to situation  ED Course  Procedures   Patient h/o acute on chronic right hip pain.  No new falls, no new leg weakness He has h/o AVN of right hip No recent trauma reported - no new imaging performed at this  time Pt can ambulate/bear weight Will give short course of pain meds and advised to call his orthopedist in 48 hours He has assistance with getting home   MDM   Final diagnoses:  Right hip pain    Nursing notes including past medical history and social history reviewed and considered in documentation Previous records reviewed and considered Narcotic database reviewed and considered in decision making     Joya Gaskins, MD 06/14/14 1456

## 2014-06-14 NOTE — ED Notes (Signed)
Pt complain of pain in his left hip. States he has been taking pain pills but they are not helping. States he needs a cortisone shot

## 2014-10-13 ENCOUNTER — Emergency Department (HOSPITAL_COMMUNITY): Payer: Medicare Other

## 2014-10-13 ENCOUNTER — Emergency Department (HOSPITAL_COMMUNITY)
Admission: EM | Admit: 2014-10-13 | Discharge: 2014-10-13 | Disposition: A | Discharge status: Home / Self Care | Payer: Medicare Other | Attending: Emergency Medicine | Admitting: Emergency Medicine

## 2014-10-13 ENCOUNTER — Encounter (HOSPITAL_COMMUNITY): Payer: Self-pay | Admitting: *Deleted

## 2014-10-13 DIAGNOSIS — Z79899 Other long term (current) drug therapy: Secondary | ICD-10-CM | POA: Diagnosis not present

## 2014-10-13 DIAGNOSIS — J449 Chronic obstructive pulmonary disease, unspecified: Secondary | ICD-10-CM | POA: Insufficient documentation

## 2014-10-13 DIAGNOSIS — Z87448 Personal history of other diseases of urinary system: Secondary | ICD-10-CM | POA: Insufficient documentation

## 2014-10-13 DIAGNOSIS — I10 Essential (primary) hypertension: Secondary | ICD-10-CM | POA: Diagnosis not present

## 2014-10-13 DIAGNOSIS — Z87891 Personal history of nicotine dependence: Secondary | ICD-10-CM | POA: Diagnosis not present

## 2014-10-13 DIAGNOSIS — Z8719 Personal history of other diseases of the digestive system: Secondary | ICD-10-CM | POA: Diagnosis not present

## 2014-10-13 DIAGNOSIS — R109 Unspecified abdominal pain: Secondary | ICD-10-CM | POA: Diagnosis present

## 2014-10-13 DIAGNOSIS — Z880 Allergy status to penicillin: Secondary | ICD-10-CM | POA: Diagnosis not present

## 2014-10-13 DIAGNOSIS — E780 Pure hypercholesterolemia: Secondary | ICD-10-CM | POA: Insufficient documentation

## 2014-10-13 DIAGNOSIS — Z862 Personal history of diseases of the blood and blood-forming organs and certain disorders involving the immune mechanism: Secondary | ICD-10-CM | POA: Insufficient documentation

## 2014-10-13 DIAGNOSIS — G4700 Insomnia, unspecified: Secondary | ICD-10-CM | POA: Insufficient documentation

## 2014-10-13 LAB — CBC WITH DIFFERENTIAL/PLATELET
Basophils Absolute: 0 10*3/uL (ref 0.0–0.1)
Basophils Relative: 0 % (ref 0–1)
Eosinophils Absolute: 0 10*3/uL (ref 0.0–0.7)
Eosinophils Relative: 0 % (ref 0–5)
HCT: 42.8 % (ref 39.0–52.0)
Hemoglobin: 14.4 g/dL (ref 13.0–17.0)
Lymphocytes Relative: 7 % — ABNORMAL LOW (ref 12–46)
Lymphs Abs: 0.7 10*3/uL (ref 0.7–4.0)
MCH: 32.4 pg (ref 26.0–34.0)
MCHC: 33.6 g/dL (ref 30.0–36.0)
MCV: 96.4 fL (ref 78.0–100.0)
Monocytes Absolute: 0.7 10*3/uL (ref 0.1–1.0)
Monocytes Relative: 6 % (ref 3–12)
Neutro Abs: 8.8 10*3/uL — ABNORMAL HIGH (ref 1.7–7.7)
Neutrophils Relative %: 87 % — ABNORMAL HIGH (ref 43–77)
Platelets: 207 10*3/uL (ref 150–400)
RBC: 4.44 MIL/uL (ref 4.22–5.81)
RDW: 12.9 % (ref 11.5–15.5)
WBC: 10.2 10*3/uL (ref 4.0–10.5)

## 2014-10-13 LAB — URINALYSIS, ROUTINE W REFLEX MICROSCOPIC
Glucose, UA: NEGATIVE mg/dL
Hgb urine dipstick: NEGATIVE
Ketones, ur: 15 mg/dL — AB
LEUKOCYTES UA: NEGATIVE
Nitrite: NEGATIVE
PROTEIN: NEGATIVE mg/dL
Specific Gravity, Urine: 1.02 (ref 1.005–1.030)
UROBILINOGEN UA: 0.2 mg/dL (ref 0.0–1.0)
pH: 6.5 (ref 5.0–8.0)

## 2014-10-13 LAB — COMPREHENSIVE METABOLIC PANEL
ALT: 22 U/L (ref 17–63)
AST: 30 U/L (ref 15–41)
Albumin: 4.1 g/dL (ref 3.5–5.0)
Alkaline Phosphatase: 78 U/L (ref 38–126)
Anion gap: 11 (ref 5–15)
BUN: 15 mg/dL (ref 6–20)
CO2: 29 mmol/L (ref 22–32)
Calcium: 8.9 mg/dL (ref 8.9–10.3)
Chloride: 96 mmol/L — ABNORMAL LOW (ref 101–111)
Creatinine, Ser: 0.64 mg/dL (ref 0.61–1.24)
GFR calc Af Amer: >60 mL/min (ref >60)
GFR calc non Af Amer: >60 mL/min (ref >60)
Glucose, Bld: 152 mg/dL — ABNORMAL HIGH (ref 65–99)
Potassium: 3.6 mmol/L (ref 3.5–5.1)
Sodium: 136 mmol/L (ref 135–145)
Total Bilirubin: 1.1 mg/dL (ref 0.3–1.2)
Total Protein: 7.2 g/dL (ref 6.5–8.1)

## 2014-10-13 LAB — TROPONIN I: Troponin I: <0.03 ng/mL (ref <0.031)

## 2014-10-13 LAB — LIPASE, BLOOD: Lipase: 17 U/L — ABNORMAL LOW (ref 22–51)

## 2014-10-13 MED ORDER — HYDROMORPHONE HCL 1 MG/ML IJ SOLN
0.5000 mg | Freq: Once | INTRAMUSCULAR | Status: AC
  Administered 2014-10-13: 0.5 mg via INTRAVENOUS
  Filled 2014-10-13: qty 1

## 2014-10-13 MED ORDER — SODIUM CHLORIDE 0.9 % IV BOLUS (SEPSIS)
500.0000 mL | Freq: Once | INTRAVENOUS | Status: AC
  Administered 2014-10-13: 500 mL via INTRAVENOUS

## 2014-10-13 NOTE — ED Provider Notes (Signed)
CSN: [id number]     Arrival date & time 10/13/14  1229 History   First MD Initiated Contact with Patient 10/13/14 1314     Chief Complaint  Patient presents with  . Flank Pain    left     (Consider location/radiation/quality/duration/timing/severity/associated sxs/prior Treatment) HPI..... Level V caveat for mild dementia. Patient complains of left upper quadrant pain with radiation to the left flank earlier today. Symptoms have now improved. He is eating and ambulatory. He is on regular pain medication for chronic right hip pain. His family says he is acting normal. No vomiting, diarrhea, dysuria, fever, chills, anterior chest pain, dyspnea.  Past Medical History  Diagnosis Date  . Hypertension   . Hypercholesterolemia   . GERD (gastroesophageal reflux disease)   . COPD (chronic obstructive pulmonary disease)   . Chronic insomnia   . BPH (benign prostatic hyperplasia)   . Anemia 08/06/2011   Past Surgical History  Procedure Laterality Date  . Colonoscopy  03/31/10    Dr. Darrick Penna  . Right knee      x3  . Nose surgery    . Hip pinning,cannulated  08/30/2011    Procedure: CANNULATED HIP PINNING;  Surgeon: Vickki Hearing, MD;  Location: [facility];  Service: Orthopedics;  Laterality: Left;   History reviewed. No pertinent family history. History  Substance Use Topics  . Smoking status: Former Smoker -- 0.25 packs/day for 55 years    Types: Cigarettes  . Smokeless tobacco: Not on file  . Alcohol Use: 1.2 oz/week    2 Shots of liquor per week    Review of Systems  Unable to perform ROS: Other      Allergies  Milk-related compounds; Aspirin; and Penicillins  Home Medications   Prior to Admission medications   Medication Sig Start Date End Date Taking? Authorizing Provider  acetaminophen (TYLENOL) 500 MG tablet Take 500-1,000 mg by mouth every 6 (six) hours as needed. Pain   Yes Historical Provider, MD  amLODipine (NORVASC) 10 MG tablet Take 10 mg by mouth daily.  08/06/14  Yes Historical Provider, MD  atorvastatin (LIPITOR) 10 MG tablet Take 10 mg by mouth daily.     Yes Historical Provider, MD  methadone (DOLOPHINE) 5 MG tablet TK HALF OF A T PO TID 09/17/14  Yes Historical Provider, MD  mirtazapine (REMERON) 15 MG tablet Take 7.5 mg by mouth at bedtime.    Yes Historical Provider, MD  oxyCODONE-acetaminophen (PERCOCET/ROXICET) 5-325 MG per tablet Take 1 tablet by mouth every 8 (eight) hours as needed for severe pain. 06/14/14  Yes Zadie Rhine, MD   BP 117/85 mmHg  Pulse 85  Temp(Src) 98.3 F (36.8 C) (Oral)  Resp 24  Ht  (1.803 m)  Wt 155 lb (70.308 kg)  BMI 21.63 kg/m2  SpO2 96% Physical Exam  Constitutional:  Does not appear in acute distress  HENT:  Head: Normocephalic and atraumatic.  Eyes: Conjunctivae and EOM are normal. Pupils are equal, round, and reactive to light.  Neck: Normal range of motion. Neck supple.  Cardiovascular: Normal rate and regular rhythm.   Pulmonary/Chest: Effort normal and breath sounds normal.  Abdominal: Soft. Bowel sounds are normal.  Musculoskeletal: Normal range of motion.  Neurological: He is alert.  Skin: Skin is warm and dry.  Psychiatric:  talkative  Nursing note and vitals reviewed.   ED Course  Procedures (including critical care time) Labs Review Labs Reviewed  CBC WITH DIFFERENTIAL/PLATELET - Abnormal; Notable for the following:  Neutrophils Relative % 87 (*)    Neutro Abs 8.8 (*)    Lymphocytes Relative 7 (*)    All other components within normal limits  COMPREHENSIVE METABOLIC PANEL - Abnormal; Notable for the following:    Chloride 96 (*)    Glucose, Bld 152 (*)    All other components within normal limits  URINALYSIS, ROUTINE W REFLEX MICROSCOPIC (NOT AT [facility]) - Abnormal; Notable for the following:    Bilirubin Urine SMALL (*)    Ketones, ur 15 (*)    All other components within normal limits  LIPASE, BLOOD - Abnormal; Notable for the following:    Lipase 17 (*)    All  other components within normal limits  TROPONIN I    Imaging Review Dg Abd Acute W/chest  10/13/2014   CLINICAL DATA:  Epigastric and left-sided flank pain.  EXAM: DG ABDOMEN ACUTE W/ 1V CHEST  COMPARISON:  Chest x-ray on 08/29/2011  FINDINGS: Chest shows evidence of probable emphysema/COPD. Bibasilar scarring present. There is no evidence of pulmonary edema, consolidation, pneumothorax, nodule or pleural fluid. Old right-sided rib fractures. The heart size is at the upper limits of normal.  Abdominal films demonstrate no evidence of bowel obstruction. Mild gaseous prominence of small bowel and colon may relate to underlying ileus. No free air, abnormal calcifications or soft tissue abnormalities. Diffuse degenerative changes are present throughout the visualized spine and involving the right hip.  IMPRESSION: Possible mild small bowel and colonic ileus. No other acute findings.   Electronically Signed   By: Irish Lack M.D.   On: 10/13/2014 15:25     EKG Interpretation None      MDM   Final diagnoses:  Left flank pain    Patient does not appear ill. Acute abdominal series shows possible ileus, however this does not fit his clinical presentation. Urinalysis shows no infection. White count normal. Troponin negative. No obvious pneumonia. He has primary care follow-up.    Donnetta Hutching, MD 10/13/14 [phone]

## 2014-10-13 NOTE — ED Notes (Addendum)
Per [facility], pt had lt sided flank pain with sudden onset, denies N/V. Pt on chronic O2 2L.

## 2014-10-13 NOTE — Discharge Instructions (Signed)
Tests showed no life-threatening condition.  Follow-up your primary care doctor. °

## 2014-10-13 NOTE — ED Notes (Signed)
MD at bedside. 

## 2016-04-12 ENCOUNTER — Encounter (HOSPITAL_COMMUNITY): Payer: Self-pay

## 2016-04-12 ENCOUNTER — Emergency Department (HOSPITAL_COMMUNITY)
Admission: EM | Admit: 2016-04-12 | Discharge: 2016-04-12 | Disposition: A | Discharge status: Home / Self Care | Payer: Medicare Other | Attending: Emergency Medicine | Admitting: Emergency Medicine

## 2016-04-12 DIAGNOSIS — Z87891 Personal history of nicotine dependence: Secondary | ICD-10-CM | POA: Diagnosis not present

## 2016-04-12 DIAGNOSIS — L988 Other specified disorders of the skin and subcutaneous tissue: Secondary | ICD-10-CM | POA: Insufficient documentation

## 2016-04-12 DIAGNOSIS — I10 Essential (primary) hypertension: Secondary | ICD-10-CM | POA: Insufficient documentation

## 2016-04-12 DIAGNOSIS — Z79899 Other long term (current) drug therapy: Secondary | ICD-10-CM | POA: Diagnosis not present

## 2016-04-12 DIAGNOSIS — J449 Chronic obstructive pulmonary disease, unspecified: Secondary | ICD-10-CM | POA: Insufficient documentation

## 2016-04-12 DIAGNOSIS — L909 Atrophic disorder of skin, unspecified: Secondary | ICD-10-CM

## 2016-04-12 DIAGNOSIS — R238 Other skin changes: Secondary | ICD-10-CM

## 2016-04-12 NOTE — ED Notes (Signed)
Pt refusing any test and to stay in hospital for any reason. Doctor talking to family about plan of care for him.

## 2016-04-12 NOTE — ED Provider Notes (Signed)
[facility] Provider Note   CSN: [id number] Arrival date & time: 04/12/16  1749     History   Chief Complaint Chief Complaint  Patient presents with  . Wound Infection    HPI John Chambers is a 82 y.o. male.  Patient brought in by EMS from home. 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 Family members referred him in for 2 concerns one was of breathing problems however patient denied any of that. Patient is known to have COPD and is on albuterol nebulizer as well as steroid inhaler. Patient normally on oxygen. Has oxygen at home. Patient's main concern was S skin sores on his buttocks areas had trouble with skin breakdown there before they feel that it is broken down again. Family also agreed with that concern. Stated they had seen some blood there. Patient does not one to be admitted.      Past Medical History:  Diagnosis Date  . Anemia 08/06/2011  . BPH (benign prostatic hyperplasia)   . Chronic insomnia   . COPD (chronic obstructive pulmonary disease) (HCC)   . GERD (gastroesophageal reflux disease)   . Hypercholesterolemia   . Hypertension     Patient Active Problem List   Diagnosis Date Noted  . Hip fracture (HCC) 09/14/2011  . Disorders of bursae and tendons in shoulder region, unspecified 09/14/2011  . Hip fracture requiring operative repair, left, closed, initial encounter (HCC) 08/29/2011  . Chronic respiratory failure (HCC) 08/29/2011  . Anemia 08/06/2011  . Hypoxia 08/05/2011  . Hyponatremia 08/04/2011  . Pneumonia, organism unspecified(486) 08/03/2011  . HTN (hypertension) 12/06/2010  . Hyperlipidemia 12/06/2010  . COPD (chronic obstructive pulmonary disease) (HCC) 12/06/2010  . GERD (gastroesophageal reflux disease) 12/06/2010    Past Surgical History:  Procedure Laterality Date  . COLONOSCOPY  03/31/10   Dr. Darrick PennaFields  . HIP PINNING,CANNULATED  08/30/2011   Procedure: CANNULATED HIP PINNING;  Surgeon: Vickki HearingStanley E Harrison, MD;   Location: [facility];  Service: Orthopedics;  Laterality: Left;  . NOSE SURGERY    . right knee     x3       Home Medications    Prior to Admission medications   Medication Sig Start Date End Date Taking? Authorizing Provider  acetaminophen (TYLENOL) 500 MG tablet Take 500-1,000 mg by mouth every 6 (six) hours as needed. Pain    Historical Provider, MD  amLODipine (NORVASC) 10 MG tablet Take 10 mg by mouth daily. 08/06/14   Historical Provider, MD  atorvastatin (LIPITOR) 10 MG tablet Take 10 mg by mouth daily.      Historical Provider, MD  methadone (DOLOPHINE) 5 MG tablet TK HALF OF A T PO TID 09/17/14   Historical Provider, MD  mirtazapine (REMERON) 15 MG tablet Take 7.5 mg by mouth at bedtime.     Historical Provider, MD  oxyCODONE-acetaminophen (PERCOCET/ROXICET) 5-325 MG per tablet Take 1 tablet by mouth every 8 (eight) hours as needed for severe pain. 06/14/14   Zadie Rhineonald Wickline, MD    Family History No family history on file.  Social History Social History  Substance Use Topics  . Smoking status: Former Smoker    Packs/day: 0.25    Years: 55.00    Types: Cigarettes  . Smokeless tobacco: Never Used  . Alcohol use 1.2 oz/week    2 Shots of liquor per week     Comment: pt says he usually drinks "a little" before he eats.     Allergies   Milk-related  compounds; Aspirin; and Penicillins   Review of Systems Review of Systems  Constitutional: Positive for fatigue. Negative for fever.  HENT: Negative for congestion.   Eyes: Negative for redness.  Respiratory: Negative for shortness of breath.   Cardiovascular: Negative for chest pain.  Gastrointestinal: Negative for abdominal pain.  Genitourinary: Negative for dysuria.  Musculoskeletal: Negative for back pain.  Skin: Positive for rash.  Neurological: Positive for weakness.  Hematological: Bruises/bleeds easily.  Psychiatric/Behavioral: Negative for confusion.     Physical Exam Updated Vital Signs BP 101/60 (BP  Location: Left Arm)   Pulse 92   Temp 98 F (36.7 C) (Oral)   Resp 22   Ht 5\' 8"  (1.727 m)   Wt 63.5 kg   SpO2 95%   BMI 21.29 kg/m   Physical Exam  Constitutional: He is oriented to person, place, and time. He appears well-developed and well-nourished. No distress.  HENT:  Head: Normocephalic.  Mouth/Throat: Oropharynx is clear and moist.  Eyes: Conjunctivae and EOM are normal. Pupils are equal, round, and reactive to light.  Neck: Normal range of motion. Neck supple.  Cardiovascular: Normal rate, regular rhythm and normal heart sounds.   Pulmonary/Chest: Effort normal and breath sounds normal. No respiratory distress. He has no wheezes.  Abdominal: Soft. Bowel sounds are normal. There is no tenderness.  Musculoskeletal: He exhibits no edema.  Neurological: He is alert and oriented to person, place, and time. No cranial nerve deficit.  Patient held to move all extremities. Based states that for long. At times been too weak to walk.  Skin:  Skin with areas of ecchymosis. Buttocks area and sacral area with some superficial skin breakdown no ulceration jet no active bleeding. No signs of secondary infection.  Nursing note and vitals reviewed.    ED Treatments / Results  Labs (all labs ordered are listed, but only abnormal results are displayed) Labs Reviewed - No data to display  EKG  EKG Interpretation None       Radiology No results found.  Procedures Procedures (including critical care time)  Medications Ordered in ED Medications - No data to display   Initial Impression / Assessment and Plan / ED Course  I have reviewed the triage vital signs and the nursing notes.  Pertinent labs & imaging results that were available during my care of the patient were reviewed by me and considered in my medical decision making (see chart for details).  Clinical Course     Long discussion with family. Daughter is power of attorney. The patient refuses admission or further  workup. Daughter does not want force him into it. Apparently patient has a long history of drinking heavily. They've had [facility] [employer] out. Apparently there is nothing that can be done. Daughter does not want force of the workup here tonight. Patient's oxygen saturation on 3 L is in the upper 90s. No obvious shortness of breath no wheezing. Patient was some early skin breakdown on the buttocks area but no deep decubiti or ulcerations. Patient does have a CNA at takes care of him at home. Patient has been seen by Dr. Juanetta Gosling in the past encourage patient to follow-up with him and continue his breathing treatments.  Patient is mentally sound and able decided he does not want treatment. No concerns for suicidal or homicidal ideation. Family's trying to get in treatment but he doesn't want it.  Final Clinical Impressions(s) / ED Diagnoses   Final diagnoses:  Skin breakdown  Chronic obstructive pulmonary disease, unspecified  COPD type [facility](HCC)    New Prescriptions New Prescriptions   No medications on file     Vanetta MuldersScott Izzy Courville, MD 04/12/16 2030

## 2016-04-12 NOTE — ED Triage Notes (Signed)
EMS reports pt has been bed ridden for the past couple of months and has wound to left hip.  Pt alert and oriented.

## 2016-04-12 NOTE — Discharge Instructions (Signed)
Continue her nebulizer treatments at home. Continue use of your oxygen. Continue to have the nurse assistant check your bottom for any further skin breakdown. Continue current creams. Offered to evaluate the concerns for breathing. If you change your mind and want that further evaluate return.

## 2016-05-24 ENCOUNTER — Encounter (HOSPITAL_COMMUNITY): Payer: Self-pay | Admitting: Emergency Medicine

## 2016-05-24 ENCOUNTER — Emergency Department (HOSPITAL_COMMUNITY)
Admission: EM | Admit: 2016-05-24 | Discharge: 2016-05-25 | Disposition: A | Discharge status: Home / Self Care | Payer: Medicare Other | Attending: Emergency Medicine | Admitting: Emergency Medicine

## 2016-05-24 ENCOUNTER — Emergency Department (HOSPITAL_COMMUNITY): Payer: Medicare Other

## 2016-05-24 DIAGNOSIS — I10 Essential (primary) hypertension: Secondary | ICD-10-CM | POA: Diagnosis not present

## 2016-05-24 DIAGNOSIS — Z87891 Personal history of nicotine dependence: Secondary | ICD-10-CM | POA: Diagnosis not present

## 2016-05-24 DIAGNOSIS — R0600 Dyspnea, unspecified: Secondary | ICD-10-CM | POA: Diagnosis present

## 2016-05-24 DIAGNOSIS — R531 Weakness: Secondary | ICD-10-CM

## 2016-05-24 DIAGNOSIS — Z79899 Other long term (current) drug therapy: Secondary | ICD-10-CM | POA: Insufficient documentation

## 2016-05-24 DIAGNOSIS — I4891 Unspecified atrial fibrillation: Secondary | ICD-10-CM | POA: Diagnosis not present

## 2016-05-24 DIAGNOSIS — J449 Chronic obstructive pulmonary disease, unspecified: Secondary | ICD-10-CM | POA: Diagnosis not present

## 2016-05-24 LAB — CBC WITH DIFFERENTIAL/PLATELET
BASOS ABS: 0 10*3/uL (ref 0.0–0.1)
Basophils Relative: 0 %
Eosinophils Absolute: 0 10*3/uL (ref 0.0–0.7)
Eosinophils Relative: 0 %
HEMATOCRIT: 39.1 % (ref 39.0–52.0)
Hemoglobin: 13.1 g/dL (ref 13.0–17.0)
LYMPHS PCT: 11 %
Lymphs Abs: 1 10*3/uL (ref 0.7–4.0)
MCH: 33.9 pg (ref 26.0–34.0)
MCHC: 33.5 g/dL (ref 30.0–36.0)
MCV: 101.3 fL — AB (ref 78.0–100.0)
Monocytes Absolute: 0.9 10*3/uL (ref 0.1–1.0)
Monocytes Relative: 9 %
NEUTROS ABS: 7.4 10*3/uL (ref 1.7–7.7)
Neutrophils Relative %: 80 %
Platelets: 298 10*3/uL (ref 150–400)
RBC: 3.86 MIL/uL — ABNORMAL LOW (ref 4.22–5.81)
RDW: 13.2 % (ref 11.5–15.5)
WBC: 9.3 10*3/uL (ref 4.0–10.5)

## 2016-05-24 LAB — BASIC METABOLIC PANEL
Anion gap: 7 (ref 5–15)
BUN: 6 mg/dL (ref 6–20)
CO2: 33 mmol/L — AB (ref 22–32)
Calcium: 8.4 mg/dL — ABNORMAL LOW (ref 8.9–10.3)
Chloride: 97 mmol/L — ABNORMAL LOW (ref 101–111)
Creatinine, Ser: 0.79 mg/dL (ref 0.61–1.24)
GFR calc Af Amer: >60 mL/min (ref >60)
GFR calc non Af Amer: >60 mL/min (ref >60)
GLUCOSE: 103 mg/dL — AB (ref 65–99)
Potassium: 3 mmol/L — ABNORMAL LOW (ref 3.5–5.1)
Sodium: 137 mmol/L (ref 135–145)

## 2016-05-24 LAB — TROPONIN I: Troponin I: <0.03 ng/mL (ref <0.03)

## 2016-05-24 MED ORDER — OXYCODONE-ACETAMINOPHEN 5-325 MG PO TABS
1.0000 | ORAL_TABLET | Freq: Once | ORAL | Status: AC
  Administered 2016-05-24: 1 via ORAL
  Filled 2016-05-24: qty 1

## 2016-05-24 MED ORDER — SODIUM CHLORIDE 0.9 % IV BOLUS (SEPSIS): 1000.0000 mL | Freq: Once | INTRAVENOUS | Status: DC

## 2016-05-24 NOTE — ED Provider Notes (Signed)
[facility] Provider Note   CSN: [id number] Arrival date & time: 05/24/16  2054   By signing my name below, I, Bobbie Stackhristopher Reid, attest that this documentation has been prepared under the direction and in the presence of Donnetta HutchingBrian Yizel Canby, MD. Electronically Signed: Bobbie Stackhristopher Reid, Scribe. 05/24/16. 10:01 PM. History   Chief Complaint Chief Complaint  Patient presents with  . Skin Ulcer     The history is provided by the patient and a relative. No language interpreter was used.  HPI Comments: John Chambers is a 83 y.o. male brought in by ambulance, who presents to the Emergency Department with respiratory distress since around 5 pm today. He also reports feeling a little weak today. Patient states that he is currently feeling fine. He states that he has never felt this way before. Per family: The patient's breathing is worsened when laying on his back. He typically has to be sitting in an upright position in order to breathe with no complications. He is not able to ambulate which is typical. He is currently on 3 liters of oxygen at home. He has a caregiver which they believe currently has the flu.  Past Medical History:  Diagnosis Date  . Anemia 08/06/2011  . BPH (benign prostatic hyperplasia)   . Chronic insomnia   . COPD (chronic obstructive pulmonary disease) (HCC)   . GERD (gastroesophageal reflux disease)   . Hypercholesterolemia   . Hypertension     Patient Active Problem List   Diagnosis Date Noted  . Hip fracture (HCC) 09/14/2011  . Disorders of bursae and tendons in shoulder region, unspecified 09/14/2011  . Hip fracture requiring operative repair, left, closed, initial encounter (HCC) 08/29/2011  . Chronic respiratory failure (HCC) 08/29/2011  . Anemia 08/06/2011  . Hypoxia 08/05/2011  . Hyponatremia 08/04/2011  . Pneumonia, organism unspecified(486) 08/03/2011  . HTN (hypertension) 12/06/2010  . Hyperlipidemia 12/06/2010  . COPD (chronic obstructive pulmonary  disease) (HCC) 12/06/2010  . GERD (gastroesophageal reflux disease) 12/06/2010    Past Surgical History:  Procedure Laterality Date  . COLONOSCOPY  03/31/10   Dr. Darrick PennaFields  . HIP PINNING,CANNULATED  08/30/2011   Procedure: CANNULATED HIP PINNING;  Surgeon: Vickki HearingStanley E Harrison, MD;  Location: [facility];  Service: Orthopedics;  Laterality: Left;  . NOSE SURGERY    . right knee     x3       Home Medications    Prior to Admission medications   Medication Sig Start Date End Date Taking? Authorizing Provider  acetaminophen (TYLENOL) 500 MG tablet Take 500-1,000 mg by mouth every 6 (six) hours as needed. Pain   Yes Historical Provider, MD  amLODipine (NORVASC) 10 MG tablet Take 10 mg by mouth daily. 08/06/14  Yes Historical Provider, MD  atorvastatin (LIPITOR) 10 MG tablet Take 10 mg by mouth every evening.    Yes Historical Provider, MD  escitalopram (LEXAPRO) 10 MG tablet Take 10 mg by mouth daily.  03/01/16  Yes Historical Provider, MD  Infant Care Products (DERMACLOUD) CREA APPLY EXTERNALLY AS NEEDED/AS DIRECTED 05/17/16  Yes Historical Provider, MD  ipratropium-albuterol (DUONEB) 0.5-2.5 (3) MG/3ML SOLN USE 1 VIAL IN NEBULIZER TWICE DAILY AS DIRECTED 04/27/16  Yes Historical Provider, MD  methadone (DOLOPHINE) 5 MG tablet Take one-half tablet by mouth three times daily 09/17/14  Yes Historical Provider, MD  mirtazapine (REMERON) 30 MG tablet Take 30 mg by mouth at bedtime.  03/23/16  Yes Historical Provider, MD  oxymetazoline (NASAL SPRAY 12 HOUR) 0.05 % nasal spray Place 1  spray into both nostrils 2 (two) times daily as needed for congestion.   Yes Historical Provider, MD  SPIRIVA RESPIMAT 1.25 MCG/ACT AERS Inhale 1 puff into the lungs daily.  03/24/16  Yes Historical Provider, MD  tamsulosin (FLOMAX) 0.4 MG CAPS capsule Take 0.4 mg by mouth daily.  03/23/16  Yes Historical Provider, MD  oxyCODONE-acetaminophen (PERCOCET/ROXICET) 5-325 MG per tablet Take 1 tablet by mouth every 8 (eight) hours as  needed for severe pain. 06/14/14   Zadie Rhine, MD    Family History No family history on file.  Social History Social History  Substance Use Topics  . Smoking status: Former Smoker    Packs/day: 0.25    Years: 55.00    Types: Cigarettes  . Smokeless tobacco: Never Used  . Alcohol use No     Comment: pt says he usually drinks "a little" before he eats.     Allergies   Milk-related compounds; Aspirin; and Penicillins   Review of Systems Review of Systems A complete 10 system review of systems was obtained and all systems are negative except as noted in the HPI and PMH.   Physical Exam Updated Vital Signs BP 104/76   Pulse 100   Temp 97.6 F (36.4 C) (Oral)   Resp 19   Ht 5\' 8"  (1.727 m)   Wt 140 lb (63.5 kg)   SpO2 97%   BMI 21.29 kg/m   Physical Exam  Constitutional: He is oriented to person, place, and time.  Alert. Interactive. Nasal canula in place. Pale.  HENT:  Head: Normocephalic and atraumatic.  Eyes: Conjunctivae are normal.  Neck: Neck supple.  Cardiovascular: Normal rate and regular rhythm.   Pulmonary/Chest: Effort normal and breath sounds normal.  Abdominal: Soft. Bowel sounds are normal.  Musculoskeletal: Normal range of motion.  Neurological: He is alert and oriented to person, place, and time.  Skin: Skin is warm and dry.  Psychiatric: He has a normal mood and affect. His behavior is normal.  Nursing note and vitals reviewed.    ED Treatments / Results  DIAGNOSTIC STUDIES: Oxygen Saturation is 97% on RA, normal by my interpretation.    COORDINATION OF CARE: 9:42 PM Discussed treatment plan with pt at bedside and pt agreed to plan. I have ordered CXR, urine sample, and IV fluids.  Labs (all labs ordered are listed, but only abnormal results are displayed) Labs Reviewed  CBC WITH DIFFERENTIAL/PLATELET - Abnormal; Notable for the following:       Result Value   RBC 3.86 (*)    MCV 101.3 (*)    All other components within normal  limits  BASIC METABOLIC PANEL - Abnormal; Notable for the following:    Potassium 3.0 (*)    Chloride 97 (*)    CO2 33 (*)    Glucose, Bld 103 (*)    Calcium 8.4 (*)    All other components within normal limits  TROPONIN I    EKG  EKG Interpretation  Date/Time:  Tuesday May 24 2016 23:16:50 EST Ventricular Rate:  103 PR Interval:    QRS Duration: 78 QT Interval:  358 QTC Calculation: 469 R Axis:   79 Text Interpretation:  Atrial fibrillation Low voltage, precordial leads Borderline repolarization abnormality Confirmed by Adriana Simas  MD, Xzavian Semmel ([id number]) on 05/24/2016 11:42:31 PM       Radiology Dg Chest Port 1 View  Result Date: 05/24/2016 CLINICAL DATA:  Dyspnea, syncope EXAM: PORTABLE CHEST 1 VIEW COMPARISON:  08/29/2011 and 10/13/2014 FINDINGS: Heart is  top normal. Aorta is atherosclerotic. No aneurysm noted. Hyperinflated lungs with minimal right basilar atelectasis. No pulmonary consolidation, effusion or pneumothorax. Old posttraumatic deformity of the distal right clavicle. Old right-sided eighth through tenth rib fractures. IMPRESSION: Hyperinflated lungs without acute pulmonary disease. Electronically Signed   By: Tollie Eth M.D.   On: 05/24/2016 22:33    Procedures Procedures (including critical care time)  Medications Ordered in ED Medications  oxyCODONE-acetaminophen (PERCOCET/ROXICET) 5-325 MG per tablet 1 tablet (1 tablet Oral Given 05/24/16 2312)     Initial Impression / Assessment and Plan / ED Course  I have reviewed the triage vital signs and the nursing notes.  Pertinent labs & imaging results that were available during my care of the patient were reviewed by me and considered in my medical decision making (see chart for details).     Patient states he has been in atrial fibrillation before. He is feeling back to normal. Family reports normal behavior. Screening tests show no life-threatening condition. This was all discussed with the patient and his family.  He has primary care follow-up.  Final Clinical Impressions(s) / ED Diagnoses   Final diagnoses:  Weakness  Atrial fibrillation, unspecified type [facility])    New Prescriptions Discharge Medication List as of 05/24/2016 11:49 PM     I personally performed the services described in this documentation, which was scribed in my presence. The recorded information has been reviewed and is accurate.     Donnetta Hutching, MD 05/26/16 [phone]

## 2016-05-24 NOTE — ED Triage Notes (Signed)
Per EMS: pt "does not have a complaint". States they were called out earlier but pt refused transport. Family talked with pt and persuaded him to come and "get checked out."

## 2016-05-24 NOTE — Discharge Instructions (Signed)
Patient has atrial fibrillation. Recommend one baby aspirin daily. Otherwise test were good tonight. Follow-up your primary care doctor.

## 2016-05-25 NOTE — ED Notes (Signed)
Pt carried out by [facility] via stretcher. Pt verbalized understanding of discharge instructions.

## 2016-11-09 ENCOUNTER — Encounter (HOSPITAL_COMMUNITY): Payer: Self-pay | Admitting: Cardiology

## 2016-11-09 ENCOUNTER — Inpatient Hospital Stay (HOSPITAL_COMMUNITY)
Admission: EM | Admit: 2016-11-09 | Discharge: 2016-11-11 | DRG: 871 | Disposition: A | Discharge status: Hospice | Attending: Internal Medicine | Admitting: Internal Medicine

## 2016-11-09 ENCOUNTER — Emergency Department (HOSPITAL_COMMUNITY)

## 2016-11-09 DIAGNOSIS — I313 Pericardial effusion (noninflammatory): Secondary | ICD-10-CM | POA: Diagnosis not present

## 2016-11-09 DIAGNOSIS — L89153 Pressure ulcer of sacral region, stage 3: Secondary | ICD-10-CM | POA: Diagnosis present

## 2016-11-09 DIAGNOSIS — I10 Essential (primary) hypertension: Secondary | ICD-10-CM | POA: Diagnosis present

## 2016-11-09 DIAGNOSIS — I482 Chronic atrial fibrillation: Secondary | ICD-10-CM | POA: Diagnosis present

## 2016-11-09 DIAGNOSIS — D539 Nutritional anemia, unspecified: Secondary | ICD-10-CM | POA: Diagnosis present

## 2016-11-09 DIAGNOSIS — Z515 Encounter for palliative care: Secondary | ICD-10-CM | POA: Diagnosis present

## 2016-11-09 DIAGNOSIS — L89620 Pressure ulcer of left heel, unstageable: Secondary | ICD-10-CM | POA: Diagnosis present

## 2016-11-09 DIAGNOSIS — E785 Hyperlipidemia, unspecified: Secondary | ICD-10-CM | POA: Diagnosis present

## 2016-11-09 DIAGNOSIS — Z79899 Other long term (current) drug therapy: Secondary | ICD-10-CM | POA: Diagnosis not present

## 2016-11-09 DIAGNOSIS — J189 Pneumonia, unspecified organism: Secondary | ICD-10-CM | POA: Diagnosis present

## 2016-11-09 DIAGNOSIS — J441 Chronic obstructive pulmonary disease with (acute) exacerbation: Secondary | ICD-10-CM | POA: Diagnosis present

## 2016-11-09 DIAGNOSIS — R6520 Severe sepsis without septic shock: Secondary | ICD-10-CM | POA: Diagnosis present

## 2016-11-09 DIAGNOSIS — F5104 Psychophysiologic insomnia: Secondary | ICD-10-CM | POA: Diagnosis present

## 2016-11-09 DIAGNOSIS — L8990 Pressure ulcer of unspecified site, unspecified stage: Secondary | ICD-10-CM | POA: Insufficient documentation

## 2016-11-09 DIAGNOSIS — K219 Gastro-esophageal reflux disease without esophagitis: Secondary | ICD-10-CM | POA: Diagnosis present

## 2016-11-09 DIAGNOSIS — Z87891 Personal history of nicotine dependence: Secondary | ICD-10-CM

## 2016-11-09 DIAGNOSIS — J449 Chronic obstructive pulmonary disease, unspecified: Secondary | ICD-10-CM | POA: Diagnosis present

## 2016-11-09 DIAGNOSIS — D649 Anemia, unspecified: Secondary | ICD-10-CM | POA: Diagnosis present

## 2016-11-09 DIAGNOSIS — A419 Sepsis, unspecified organism: Secondary | ICD-10-CM | POA: Diagnosis present

## 2016-11-09 DIAGNOSIS — Z66 Do not resuscitate: Secondary | ICD-10-CM | POA: Diagnosis present

## 2016-11-09 DIAGNOSIS — Z880 Allergy status to penicillin: Secondary | ICD-10-CM | POA: Diagnosis not present

## 2016-11-09 DIAGNOSIS — Z9981 Dependence on supplemental oxygen: Secondary | ICD-10-CM | POA: Diagnosis not present

## 2016-11-09 DIAGNOSIS — E871 Hypo-osmolality and hyponatremia: Secondary | ICD-10-CM | POA: Diagnosis present

## 2016-11-09 DIAGNOSIS — L89229 Pressure ulcer of left hip, unspecified stage: Secondary | ICD-10-CM | POA: Diagnosis present

## 2016-11-09 DIAGNOSIS — R0603 Acute respiratory distress: Secondary | ICD-10-CM | POA: Diagnosis not present

## 2016-11-09 DIAGNOSIS — E7800 Pure hypercholesterolemia, unspecified: Secondary | ICD-10-CM | POA: Diagnosis present

## 2016-11-09 DIAGNOSIS — J9621 Acute and chronic respiratory failure with hypoxia: Secondary | ICD-10-CM | POA: Diagnosis present

## 2016-11-09 DIAGNOSIS — J181 Lobar pneumonia, unspecified organism: Secondary | ICD-10-CM

## 2016-11-09 DIAGNOSIS — I4891 Unspecified atrial fibrillation: Secondary | ICD-10-CM | POA: Diagnosis present

## 2016-11-09 DIAGNOSIS — J440 Chronic obstructive pulmonary disease with acute lower respiratory infection: Secondary | ICD-10-CM | POA: Diagnosis present

## 2016-11-09 DIAGNOSIS — N400 Enlarged prostate without lower urinary tract symptoms: Secondary | ICD-10-CM | POA: Diagnosis present

## 2016-11-09 DIAGNOSIS — Z7189 Other specified counseling: Secondary | ICD-10-CM

## 2016-11-09 LAB — MAGNESIUM: MAGNESIUM: 1.4 mg/dL — AB (ref 1.7–2.4)

## 2016-11-09 LAB — BASIC METABOLIC PANEL
Anion gap: 17 — ABNORMAL HIGH (ref 5–15)
BUN: 26 mg/dL — AB (ref 6–20)
CALCIUM: 8.2 mg/dL — AB (ref 8.9–10.3)
CHLORIDE: 94 mmol/L — AB (ref 101–111)
CO2: 21 mmol/L — ABNORMAL LOW (ref 22–32)
CREATININE: 0.85 mg/dL (ref 0.61–1.24)
GFR calc non Af Amer: >60 mL/min (ref >60)
Glucose, Bld: 125 mg/dL — ABNORMAL HIGH (ref 65–99)
Potassium: 4.5 mmol/L (ref 3.5–5.1)
SODIUM: 132 mmol/L — AB (ref 135–145)

## 2016-11-09 LAB — BLOOD GAS, ARTERIAL
ACID-BASE DEFICIT: 3.3 mmol/L — AB (ref 0.0–2.0)
BICARBONATE: 21.6 mmol/L (ref 20.0–28.0)
DRAWN BY: 234301
FIO2: 100
O2 SAT: 93.2 %
PCO2 ART: 38.4 mmHg (ref 32.0–48.0)
Patient temperature: 37
pH, Arterial: 7.361 (ref 7.350–7.450)
pO2, Arterial: 80 mmHg — ABNORMAL LOW (ref 83.0–108.0)

## 2016-11-09 LAB — CBC WITH DIFFERENTIAL/PLATELET
BASOS PCT: 0 %
Basophils Absolute: 0 10*3/uL (ref 0.0–0.1)
EOS ABS: 0 10*3/uL (ref 0.0–0.7)
EOS PCT: 0 %
HCT: 36.7 % — ABNORMAL LOW (ref 39.0–52.0)
HEMOGLOBIN: 12.1 g/dL — AB (ref 13.0–17.0)
Lymphocytes Relative: 6 %
Lymphs Abs: 0.9 10*3/uL (ref 0.7–4.0)
MCH: 35 pg — AB (ref 26.0–34.0)
MCHC: 33 g/dL (ref 30.0–36.0)
MCV: 106.1 fL — ABNORMAL HIGH (ref 78.0–100.0)
MONOS PCT: 3 %
Monocytes Absolute: 0.4 10*3/uL (ref 0.1–1.0)
NEUTROS PCT: 91 %
Neutro Abs: 12.3 10*3/uL — ABNORMAL HIGH (ref 1.7–7.7)
Platelets: 394 10*3/uL (ref 150–400)
RBC: 3.46 MIL/uL — ABNORMAL LOW (ref 4.22–5.81)
RDW: 14.7 % (ref 11.5–15.5)
WBC: 13.6 10*3/uL — AB (ref 4.0–10.5)

## 2016-11-09 LAB — TROPONIN I

## 2016-11-09 LAB — I-STAT CG4 LACTIC ACID, ED: Lactic Acid, Venous: 10.3 mmol/L (ref 0.5–1.9)

## 2016-11-09 LAB — LACTIC ACID, PLASMA
LACTIC ACID, VENOUS: 2.4 mmol/L — AB (ref 0.5–1.9)
Lactic Acid, Venous: 7.9 mmol/L (ref 0.5–1.9)

## 2016-11-09 LAB — PHOSPHORUS: Phosphorus: 5.6 mg/dL — ABNORMAL HIGH (ref 2.5–4.6)

## 2016-11-09 LAB — CG4 I-STAT (LACTIC ACID): Lactic Acid, Venous: 3.89 mmol/L (ref 0.5–1.9)

## 2016-11-09 LAB — BRAIN NATRIURETIC PEPTIDE: B NATRIURETIC PEPTIDE 5: 513 pg/mL — AB (ref 0.0–100.0)

## 2016-11-09 MED ORDER — IPRATROPIUM BROMIDE 0.02 % IN SOLN
0.5000 mg | Freq: Four times a day (QID) | RESPIRATORY_TRACT | Status: DC
  Administered 2016-11-10 (×3): 0.5 mg via RESPIRATORY_TRACT
  Filled 2016-11-09 (×3): qty 2.5

## 2016-11-09 MED ORDER — ONDANSETRON HCL 4 MG/2ML IJ SOLN
4.0000 mg | Freq: Four times a day (QID) | INTRAMUSCULAR | Status: DC | PRN
  Administered 2016-11-10: 4 mg via INTRAVENOUS
  Filled 2016-11-09: qty 2

## 2016-11-09 MED ORDER — OXYMETAZOLINE HCL 0.05 % NA SOLN
1.0000 | Freq: Two times a day (BID) | NASAL | Status: DC | PRN
  Filled 2016-11-09: qty 15

## 2016-11-09 MED ORDER — MAGNESIUM SULFATE 2 GM/50ML IV SOLN: 2.0000 g | Freq: Once | INTRAVENOUS | Status: DC

## 2016-11-09 MED ORDER — MAGNESIUM SULFATE IN D5W 1-5 GM/100ML-% IV SOLN
INTRAVENOUS | Status: AC
Start: 2016-11-09 — End: ?
  Filled 2016-11-09: qty 200

## 2016-11-09 MED ORDER — ACETAMINOPHEN 325 MG PO TABS: 650.0000 mg | ORAL_TABLET | Freq: Four times a day (QID) | ORAL | Status: DC | PRN

## 2016-11-09 MED ORDER — VANCOMYCIN HCL 500 MG IV SOLR
INTRAVENOUS | Status: AC
Start: 2016-11-09 — End: ?
  Filled 2016-11-09: qty 500

## 2016-11-09 MED ORDER — ESCITALOPRAM OXALATE 10 MG PO TABS
10.0000 mg | ORAL_TABLET | Freq: Every day | ORAL | Status: DC
  Filled 2016-11-09: qty 1

## 2016-11-09 MED ORDER — LEVALBUTEROL HCL 0.63 MG/3ML IN NEBU: 0.6300 mg | INHALATION_SOLUTION | Freq: Four times a day (QID) | RESPIRATORY_TRACT | Status: DC | PRN

## 2016-11-09 MED ORDER — VANCOMYCIN HCL IN DEXTROSE 1-5 GM/200ML-% IV SOLN
1000.0000 mg | Freq: Once | INTRAVENOUS | Status: AC
  Administered 2016-11-09: 1000 mg via INTRAVENOUS
  Filled 2016-11-09: qty 200

## 2016-11-09 MED ORDER — ONDANSETRON HCL 4 MG PO TABS: 4.0000 mg | ORAL_TABLET | Freq: Four times a day (QID) | ORAL | Status: DC | PRN

## 2016-11-09 MED ORDER — LORAZEPAM 2 MG/ML IJ SOLN
0.5000 mg | INTRAMUSCULAR | Status: DC | PRN
  Administered 2016-11-10 (×2): 0.5 mg via INTRAVENOUS
  Filled 2016-11-09 (×2): qty 1

## 2016-11-09 MED ORDER — SENNA 8.6 MG PO TABS: 1.0000 | ORAL_TABLET | Freq: Every day | ORAL | Status: DC | PRN

## 2016-11-09 MED ORDER — SODIUM CHLORIDE 0.9 % IV SOLN
INTRAVENOUS | Status: DC
  Administered 2016-11-09 – 2016-11-10 (×2): via INTRAVENOUS

## 2016-11-09 MED ORDER — LEVALBUTEROL HCL 1.25 MG/0.5ML IN NEBU
1.2500 mg | INHALATION_SOLUTION | Freq: Four times a day (QID) | RESPIRATORY_TRACT | Status: DC
  Administered 2016-11-10 (×3): 1.25 mg via RESPIRATORY_TRACT
  Filled 2016-11-09 (×3): qty 0.5

## 2016-11-09 MED ORDER — MAGNESIUM SULFATE IN D5W 1-5 GM/100ML-% IV SOLN
1.0000 g | INTRAVENOUS | Status: AC
  Administered 2016-11-10 (×2): 1 g via INTRAVENOUS

## 2016-11-09 MED ORDER — SODIUM CHLORIDE 0.9 % IV BOLUS (SEPSIS)
1000.0000 mL | Freq: Once | INTRAVENOUS | Status: AC
  Administered 2016-11-09: 1000 mL via INTRAVENOUS

## 2016-11-09 MED ORDER — ENOXAPARIN SODIUM 30 MG/0.3ML ~~LOC~~ SOLN
30.0000 mg | SUBCUTANEOUS | Status: DC
  Administered 2016-11-09: 30 mg via SUBCUTANEOUS
  Filled 2016-11-09: qty 0.3

## 2016-11-09 MED ORDER — ENSURE ENLIVE PO LIQD
237.0000 mL | Freq: Two times a day (BID) | ORAL | Status: DC
  Administered 2016-11-09: 237 mL via ORAL
  Filled 2016-11-09 (×3): qty 237

## 2016-11-09 MED ORDER — IPRATROPIUM-ALBUTEROL 0.5-2.5 (3) MG/3ML IN SOLN
3.0000 mL | Freq: Four times a day (QID) | RESPIRATORY_TRACT | Status: DC
  Administered 2016-11-09: 3 mL via RESPIRATORY_TRACT
  Filled 2016-11-09: qty 3

## 2016-11-09 MED ORDER — LEVOFLOXACIN IN D5W 750 MG/150ML IV SOLN: 750.0000 mg | INTRAVENOUS | Status: DC

## 2016-11-09 MED ORDER — SODIUM CHLORIDE 0.9 % IV BOLUS (SEPSIS)
500.0000 mL | Freq: Once | INTRAVENOUS | Status: AC
  Administered 2016-11-09: 500 mL via INTRAVENOUS

## 2016-11-09 MED ORDER — TIOTROPIUM BROMIDE MONOHYDRATE 1.25 MCG/ACT IN AERS: 1.0000 | INHALATION_SPRAY | Freq: Every day | RESPIRATORY_TRACT | Status: DC

## 2016-11-09 MED ORDER — LEVOFLOXACIN IN D5W 750 MG/150ML IV SOLN
750.0000 mg | Freq: Once | INTRAVENOUS | Status: AC
  Administered 2016-11-09: 750 mg via INTRAVENOUS
  Filled 2016-11-09: qty 150

## 2016-11-09 MED ORDER — VANCOMYCIN HCL 500 MG IV SOLR
500.0000 mg | Freq: Two times a day (BID) | INTRAVENOUS | Status: DC
  Administered 2016-11-10: 500 mg via INTRAVENOUS
  Filled 2016-11-09 (×3): qty 500

## 2016-11-09 MED ORDER — MIRTAZAPINE 30 MG PO TABS: 30.0000 mg | ORAL_TABLET | Freq: Every day | ORAL | Status: DC

## 2016-11-09 MED ORDER — MAGNESIUM SULFATE IN D5W 1-5 GM/100ML-% IV SOLN: 1.0000 g | Freq: Once | INTRAVENOUS | Status: DC

## 2016-11-09 NOTE — ED Triage Notes (Signed)
Pt comes in from home by EMS for a respiratory distress. He is currently on hospice care. EMS was called today because of difficulty breathing. He was given morphine and ativan by family at  1430. Upon EMS arrival, pt was 52% on 3L (pt wears 3L all the time). EDP at bedside

## 2016-11-09 NOTE — Progress Notes (Signed)
o2 titrated down from nrb to 4lpm cann pt wears 3lpm cann at home spo2 100% on 100% nrb will continue to monitor

## 2016-11-09 NOTE — ED Notes (Signed)
Pt no distress at this time.  Drinking ensure

## 2016-11-09 NOTE — H&P (Signed)
History and Physical    John EvensWilliam T Mans ZOX:[id number] DOB: 11/27/1926 DOA: 11/09/2016  PCP: Isabella BowensVasireddy, Venugopal Kiran, MD   Patient coming from: Home.  I have personally briefly reviewed patient's old medical records in [facility]  Chief Complaint: Shortness of breath.  HPI: John Chambers is a 83 y.o. male with medical history significant of anemia, BPH, chronic insomnia, COPD, GERD, hyperlipidemia, hypertension who was brought from his home due to difficulty breathing earlier today. Apparently the patient was doing well yesterday evening, per his son in law. He slept late this morning and woke up around 12:30. He woke up with shortness of breath and malaise. He was given his hospice medications (30 mg sublingual morphine and 0.5 mg of Ativan about 1430, but subsequently EMS had to be called. He was found to be tachypneic, tachycardic with atrial fibrillation and RVR and hypoxemic at 52%. EMS provided supplemental oxygen, Solu-Medrol 125 mg IVP, nebulized albuterol and 200 mL of normal saline before bringing him to the emergency department. He is currently saturating in the mid 90s on nonrebreather mask and is unable to speak full sentences. His son-in-law states that he has not had his experienced regular caregivers since yesterday and family members are currently taking care of him.  ED Course: The patient was given supplemental oxygen, normal saline 2500 mL bolus, vancomycin 1000 mg and Levaquin 750 mg IVPB. His workup in the emergency department shows WBC of 13.6, hemoglobin 12.1 g/dL input is [id number]. Sodium 132, potassium 4.5, chloride 94, bicarbonate 21 and lactic acid 10.3 mmol/L. EKG shows atrial fibrillation with RVR, BNP was 513 pg/mL. ABG showed a pH of 7.36, PCO2 of 38.4, PCO2 of 80, bicarbonate 21 and O2 sat 93.2% on 100% oxygen.   Imaging: Chest radiograph showed the left lower lobe and perihilar infiltrate. Please see images sent for radiology report for further detail.  Review  of Systems: As per HPI otherwise 10 point review of systems negative.    Past Medical History:  Diagnosis Date  . Anemia 08/06/2011  . BPH (benign prostatic hyperplasia)   . Chronic insomnia   . COPD (chronic obstructive pulmonary disease) (HCC)   . GERD (gastroesophageal reflux disease)   . Hypercholesterolemia   . Hypertension     Past Surgical History:  Procedure Laterality Date  . COLONOSCOPY  03/31/10   Dr. Darrick PennaFields  . HIP PINNING,CANNULATED  08/30/2011   Procedure: CANNULATED HIP PINNING;  Surgeon: Vickki HearingStanley E Harrison, MD;  Location: [facility];  Service: Orthopedics;  Laterality: Left;  . NOSE SURGERY    . right knee     x3     reports that he has quit smoking. His smoking use included Cigarettes. He has a 13.75 pack-year smoking history. He has never used smokeless tobacco. He reports that he does not drink alcohol or use drugs.  Allergies  Allergen Reactions  . Milk-Related Compounds Other (See Comments)    Gi UPSET "SOMETIMES"  . Salicylates   . Aspirin Itching, Swelling, Rash and Other (See Comments)    GI upset  . Penicillins Rash   Family History  Problem Relation Age of Onset  . Hypertension Other      Prior to Admission medications   Medication Sig Start Date End Date Taking? Authorizing Provider  amitriptyline (ELAVIL) 10 MG tablet Take 10 mg by mouth at bedtime as needed for sleep (May take in adition to 25mg  if needed).   Yes [provider]  amitriptyline (ELAVIL) 25 MG tablet Take  25 mg by mouth at bedtime. Take 25mg  at bedtime; may take an additional 10mg  tablet if needed   Yes [provider]  morphine (ROXANOL) 20 MG/ML concentrated solution Take 10 mg by mouth every 2 (two) hours as needed for severe pain or shortness of breath.   Yes [provider]  senna (SENOKOT) 8.6 MG TABS tablet Take 1-2 tablets by mouth daily as needed for mild constipation.   Yes [provider]  spironolactone (ALDACTONE) 25 MG tablet Take  25-50 mg by mouth daily as needed.   Yes [provider]  acetaminophen (TYLENOL) 500 MG tablet Take 500-1,000 mg by mouth every 6 (six) hours as needed. Pain    [provider]  amLODipine (NORVASC) 10 MG tablet Take 10 mg by mouth daily. 08/06/14   [provider]  atorvastatin (LIPITOR) 10 MG tablet Take 10 mg by mouth every evening.     [provider]  escitalopram (LEXAPRO) 10 MG tablet Take 10 mg by mouth daily.  03/01/16   [provider]  Infant Care Products (DERMACLOUD) CREA APPLY EXTERNALLY AS NEEDED/AS DIRECTED 05/17/16   [provider]  ipratropium-albuterol (DUONEB) 0.5-2.5 (3) MG/3ML SOLN USE 1 VIAL IN NEBULIZER TWICE DAILY AS DIRECTED 04/27/16   [provider]  methadone (DOLOPHINE) 5 MG tablet Take one-half tablet by mouth three times daily 09/17/14   [provider]  mirtazapine (REMERON) 30 MG tablet Take 30 mg by mouth at bedtime.  03/23/16   [provider]  oxyCODONE-acetaminophen (PERCOCET/ROXICET) 5-325 MG per tablet Take 1 tablet by mouth every 8 (eight) hours as needed for severe pain. 06/14/14   Zadie Rhine, MD  oxymetazoline (NASAL SPRAY 12 HOUR) 0.05 % nasal spray Place 1 spray into both nostrils 2 (two) times daily as needed for congestion.    [provider]  SPIRIVA RESPIMAT 1.25 MCG/ACT AERS Inhale 1 puff into the lungs daily.  03/24/16   [provider]  tamsulosin (FLOMAX) 0.4 MG CAPS capsule Take 0.4 mg by mouth daily.  03/23/16   [provider]    Physical Exam: Vitals:   11/09/16 1613 11/09/16 1630 11/09/16 1700 11/09/16 1730  BP:  104/60 (!) 89/57 92/68  Pulse:    74  Resp:  (!) 21 (!) 21 20  Temp:      TempSrc:      SpO2:  (!) 88%    Weight: 62.3 kg (137 lb 5.6 oz)     Height: 5\' 8"  (1.727 m)       Constitutional: mild respiratory distress, looks chronically ill. Eyes: PERRL, lids and conjunctivae normal ENMT: Mucous membranes are dry.  Posterior pharynx clear of any exudate or lesions. Neck: normal, supple, no masses, no thyromegaly Respiratory: Decreased breath sounds on left lower and mid fields with mild bilateral rhonchi and wheezing, bibasilar crackles, L>>R. Tachypneic at 26 BPM. No accessory muscle use.  Cardiovascular: Tachycardic, irregularly irregular at 109 BPM, no murmurs / rubs / gallops. No extremity edema. 2+ pedal pulses. No carotid bruits.  Abdomen: Mildly distended, soft, no tenderness, no masses palpated. No hepatosplenomegaly. Bowel sounds positive.  Musculoskeletal: no clubbing / cyanosis.  Good ROM, no contractures. Normal muscle tone.  Skin: multiple areas of ecchymosis on upper extremities and right-sided cervical area. Neurologic: CN 2-12 grossly intact. Sensation intact, DTR normal. Strength 5/5 in all 4.  Psychiatric: Normal judgment and insight. Alert and oriented x 4. Normal mood.    Labs on Admission: I have personally reviewed following  labs and imaging studies  CBC:  Recent Labs Lab 11/09/16 1532  WBC 13.6*  NEUTROABS 12.3*  HGB 12.1*  HCT 36.7*  MCV 106.1*  PLT 394   Basic Metabolic Panel:  Recent Labs Lab 11/09/16 1532  NA 132*  K 4.5  CL 94*  CO2 21*  GLUCOSE 125*  BUN 26*  CREATININE 0.85  CALCIUM 8.2*   GFR: Estimated Creatinine Clearance: 54 mL/min (by C-G formula based on SCr of 0.85 mg/dL). Liver Function Tests: No results for input(s): AST, ALT, ALKPHOS, BILITOT, PROT, ALBUMIN in the last 168 hours. No results for input(s): LIPASE, AMYLASE in the last 168 hours. No results for input(s): AMMONIA in the last 168 hours. Coagulation Profile: No results for input(s): INR, PROTIME in the last 168 hours. Cardiac Enzymes:  Recent Labs Lab 11/09/16 1532  TROPONINI <0.03   BNP (last 3 results) No results for input(s): PROBNP in the last 8760 hours. HbA1C: No results for input(s): HGBA1C in the last 72 hours. CBG: No results for input(s): GLUCAP in the last  168 hours. Lipid Profile: No results for input(s): CHOL, HDL, LDLCALC, TRIG, CHOLHDL, LDLDIRECT in the last 72 hours. Thyroid Function Tests: No results for input(s): TSH, T4TOTAL, FREET4, T3FREE, THYROIDAB in the last 72 hours. Anemia Panel: No results for input(s): VITAMINB12, FOLATE, FERRITIN, TIBC, IRON, RETICCTPCT in the last 72 hours. Urine analysis:    Component Value Date/Time   COLORURINE YELLOW 10/13/2014 1610   APPEARANCEUR CLEAR 10/13/2014 1610   LABSPEC 1.020 10/13/2014 1610   PHURINE 6.5 10/13/2014 1610   GLUCOSEU NEGATIVE 10/13/2014 1610   HGBUR NEGATIVE 10/13/2014 1610   BILIRUBINUR SMALL (A) 10/13/2014 1610   KETONESUR 15 (A) 10/13/2014 1610   PROTEINUR NEGATIVE 10/13/2014 1610   UROBILINOGEN 0.2 10/13/2014 1610   NITRITE NEGATIVE 10/13/2014 1610   LEUKOCYTESUR NEGATIVE 10/13/2014 1610    Radiological Exams on Admission: Dg Chest Port 1 View  Result Date: 11/09/2016 CLINICAL DATA:  Respiratory distress. EXAM: PORTABLE CHEST 1 VIEW COMPARISON:  Radiograph of May 24, 2016. FINDINGS: Stable cardiomediastinal silhouette. Atherosclerosis of thoracic aorta is noted. No pneumothorax is noted. Right lung is clear. Interval development of left perihilar and basilar opacity is noted concerning for pneumonia or possibly edema. Bony thorax is unremarkable. IMPRESSION: Aortic atherosclerosis. Interval development of left perihilar and basilar opacity concerning for pneumonia or possibly edema. Electronically Signed   By: Lupita RaiderJames  Green Jr, M.D.   On: 11/09/2016 15:51    EKG: Independently reviewed. Vent. rate 136 BPM PR interval * ms QRS duration 75 ms QT/QTc 343/503 ms P-R-T axes * 82 * Atrial fibrillation Low voltage, extremity and precordial leads Nonspecific T abnormalities, lateral leads Prolonged QT interval  Assessment/Plan Principal Problem:   CAP (community acquired pneumonia)   Sepsis due to pneumonia (HCC) Admit to ICU/inpatient. Continue supplemental  oxygen. Continue scheduled and as needed bronchodilators. BiPAP ventilation when necessary. Continue IVF. Monitor intake and output. Monitor lactic acid level. Continue levofloxacin per pharmacy. Continue vancomycin per pharmacy. Follow-up blood cultures and sensitivity. Check a sputum Gram stain, culture and sensitivity. Check strep pneumoniae urinary antigen. Prognosis is poor given advanced age, clinical severity and comorbidities.  Active Problems:   COPD (chronic obstructive pulmonary disease) (HCC) Continue supplemental oxygen. Continue duo nebs every 6 hours. Albuterol nebs every 4 hours as needed.    HTN (hypertension) Hold antihypertensives and diuretics for now. Monitor blood pressure. Resume antihypertensive therapy once patient is not hypotensive.    Hyperlipidemia Hold atorvastatin for now.  Atrial fibrillation with RVR (HCC) CHA2DS2-VASc Score of at least 3. Not on anticoagulation for medications for rate control. Optimize electrolytes. Consider negative chromotropic medications if blood pressure allows. Probably not a good candidate for anticoagulation given advanced age and hospice status.    GERD (gastroesophageal reflux disease) Protonix 40 mg by mouth daily.    Hyponatremia Received 2500 mL of the mass bolus in the emergency department.    Anemia Monitor hematocrit and hemoglobin.    BPH (benign prostatic hyperplasia) Will hold Flomax for the moment due to hypotension. Resume once the patient's blood pressure allows.     DVT prophylaxis: Lovenox SQ. Code Status: DO NOT RESUSCITATE/DO NOT INTUBATE. Family Communication: His son in law was in the ED. Disposition Plan: Admit for pneumonia and sepsis treatment for several days. Consults called:  Admission status: Inpatient/ICU.   Bobette Mo MD [facility] Pager [phone]. If 7PM-7AM, please contact night-coverage [email or web address] Password [facility]  11/09/2016, 5:57 PM

## 2016-11-09 NOTE — ED Notes (Signed)
Pt alert.  States he is feeling better.

## 2016-11-09 NOTE — Progress Notes (Signed)
Pharmacy Note:  Initial antibiotics for Vancomycin and Levaquin ordered by EDP for pneumonia (CAP).  Pt has PCN allergy.   CrCl cannot be calculated (Patient's most recent lab result is older than the maximum 21 days allowed.).   Allergies  Allergen Reactions  . Milk-Related Compounds Other (See Comments)    Gi UPSET "SOMETIMES"  . Aspirin Itching, Swelling, Rash and Other (See Comments)    GI upset  . Penicillins Rash    Vitals:   11/09/16 1520 11/09/16 1536  BP: (!) 77/64   Pulse: (!) 120   Resp: (!) 26   Temp:  97.6 F (36.4 C)    Anti-infectives    Start     Dose/Rate Route Frequency Ordered Stop   11/09/16 1615  levofloxacin (LEVAQUIN) IVPB 750 mg     750 mg 100 mL/hr over 90 Minutes Intravenous  Once 11/09/16 1602     11/09/16 1615  vancomycin (VANCOCIN) IVPB 1000 mg/200 mL premix     1,000 mg 200 mL/hr over 60 Minutes Intravenous  Once 11/09/16 1602       Plan: Initial doses of Vancomycin 1gm and Levaquin 750mg  X 1 ordered by EDP to be given in ED. F/U admission orders for further dosing if therapy continued.  Wayland DenisHall, Darica Goren A, [facility] Hospital[facility] 11/09/2016 4:05 PM

## 2016-11-09 NOTE — ED Provider Notes (Addendum)
[facility]-EMER[facility]ENCY DEPT Provider Note   CSN: [id number] Arrival date & time: 11/09/16  1523     History   Chief Complaint Chief Complaint  Patient presents with  . Respiratory Distress    HPI John Chambers is a 83 y.o. male.Complaint is "respiratory distress".  HPI 83 year old male. History of "end-stage COPD". On 3 L of home O2 nasal cannula and hospice care. Apparently slept late this morning. "Woke up" around 12:30. Received his hospice medications of 30 sublingual morphine, and 0.5 of sublingual Ativan at about 2:30. Progressive distress with dyspnea and shortness of breath. Family did not call the hospitalist provider. 911 was called.  EMS arrived to find the patient tachypneic, tachycardic, afibrillation-irregularly irregular rapid, hypoxemic at 52%. He was given IV Solu-Medrol, immunized albuterol, mask O2, 200 fluid, and transferred here.   Past Medical History:  Diagnosis Date  . Anemia 08/06/2011  . BPH (benign prostatic hyperplasia)   . Chronic insomnia   . COPD (chronic obstructive pulmonary disease) (HCC)   . GERD (gastroesophageal reflux disease)   . Hypercholesterolemia   . Hypertension     Patient Active Problem List   Diagnosis Date Noted  . Hip fracture (HCC) 09/14/2011  . Disorders of bursae and tendons in shoulder region, unspecified 09/14/2011  . Hip fracture requiring operative repair, left, closed, initial encounter (HCC) 08/29/2011  . Chronic respiratory failure (HCC) 08/29/2011  . Anemia 08/06/2011  . Hypoxia 08/05/2011  . Hyponatremia 08/04/2011  . Pneumonia, organism unspecified(486) 08/03/2011  . HTN (hypertension) 12/06/2010  . Hyperlipidemia 12/06/2010  . COPD (chronic obstructive pulmonary disease) (HCC) 12/06/2010  . GERD (gastroesophageal reflux disease) 12/06/2010    Past Surgical History:  Procedure Laterality Date  . COLONOSCOPY  03/31/10   Dr. Darrick PennaFields  . HIP PINNING,CANNULATED  08/30/2011   Procedure: CANNULATED HIP PINNING;   Surgeon: Vickki HearingStanley E Harrison, MD;  Location: [facility];  Service: Orthopedics;  Laterality: Left;  . NOSE SURGERY    . right knee     x3       Home Medications    Prior to Admission medications   Medication Sig Start Date End Date Taking? Authorizing Provider  acetaminophen (TYLENOL) 500 MG tablet Take 500-1,000 mg by mouth every 6 (six) hours as needed. Pain    [provider]  amLODipine (NORVASC) 10 MG tablet Take 10 mg by mouth daily. 08/06/14   [provider]  atorvastatin (LIPITOR) 10 MG tablet Take 10 mg by mouth every evening.     [provider]  escitalopram (LEXAPRO) 10 MG tablet Take 10 mg by mouth daily.  03/01/16   [provider]  Infant Care Products (DERMACLOUD) CREA APPLY EXTERNALLY AS NEEDED/AS DIRECTED 05/17/16   [provider]  ipratropium-albuterol (DUONEB) 0.5-2.5 (3) MG/3ML SOLN USE 1 VIAL IN NEBULIZER TWICE DAILY AS DIRECTED 04/27/16   [provider]  methadone (DOLOPHINE) 5 MG tablet Take one-half tablet by mouth three times daily 09/17/14   [provider]  mirtazapine (REMERON) 30 MG tablet Take 30 mg by mouth at bedtime.  03/23/16   [provider]  oxyCODONE-acetaminophen (PERCOCET/ROXICET) 5-325 MG per tablet Take 1 tablet by mouth every 8 (eight) hours as needed for severe pain. 06/14/14   Zadie RhineWickline, Donald, MD  oxymetazoline (NASAL SPRAY 12 HOUR) 0.05 % nasal spray Place 1 spray into both nostrils 2 (two) times daily as needed for congestion.    [provider]  SPIRIVA RESPIMAT 1.25 MCG/ACT AERS Inhale 1 puff into the lungs daily.  03/24/16   [provider]  tamsulosin (FLOMAX) 0.4 MG CAPS capsule Take 0.4 mg by mouth daily.  03/23/16   [provider]    Family History No family history on file.  Social History Social History  Substance Use Topics  . Smoking status: Former Smoker    Packs/day: 0.25    Years: 55.00    Types: Cigarettes  . Smokeless tobacco:  Never Used  . Alcohol use No     Comment: pt says he usually drinks "a little" before he eats.     Allergies   Milk-related compounds; Salicylates; Aspirin; and Penicillins   Review of Systems Review of Systems  Unable to perform ROS: Acuity of condition     Physical Exam Updated Vital Signs BP (!) 89/57   Pulse (!) 120   Temp 97.6 F (36.4 C) (Axillary)   Resp (!) 21   Ht 5\' 8"  (1.727 m)   Wt 62.3 kg (137 lb 5.6 oz)   SpO2 (!) 88%   BMI 20.88 kg/m   Physical Exam  Constitutional: He is oriented to person, place, and time.  Elderly, thin, frail appearing. Tachypneic and tachycardic. Able to report his name and date of birth  HENT:  Head: Normocephalic.  Eyes: Pupils are equal, round, and reactive to light. Conjunctivae are normal. No scleral icterus.  Neck: Normal range of motion. Neck supple. No thyromegaly present.  Cardiovascular: Exam reveals no gallop and no friction rub.   No murmur heard. Irregularly irregular. Her cardiac. A. fib with RVR on the monitor. Palpable brachial pulse.  Pulmonary/Chest: No respiratory distress. He has no wheezes. He has no rales.  Globally diminished breath sounds. Increased work of breathing.  Abdominal: Soft. Bowel sounds are normal. He exhibits no distension. There is no tenderness. There is no rebound.  Musculoskeletal:  Thin frail  Neurological: He is alert and oriented to person, place, and time.  Skin: Skin is warm and dry. No rash noted.  Psychiatric: He has a normal mood and affect. His behavior is normal.     ED Treatments / Results  Labs (all labs ordered are listed, but only abnormal results are displayed) Labs Reviewed  BLOOD GAS, ARTERIAL - Abnormal; Notable for the following:       Result Value   pO2, Arterial 80.0 (*)    Acid-base deficit 3.3 (*)    All other components within normal limits  CBC WITH DIFFERENTIAL/PLATELET - Abnormal; Notable for the following:    WBC 13.6 (*)    RBC 3.46 (*)    Hemoglobin  12.1 (*)    HCT 36.7 (*)    MCV 106.1 (*)    MCH 35.0 (*)    Neutro Abs 12.3 (*)    All other components within normal limits  BASIC METABOLIC PANEL - Abnormal; Notable for the following:    Sodium 132 (*)    Chloride 94 (*)    CO2 21 (*)    Glucose, Bld 125 (*)    BUN 26 (*)    Calcium 8.2 (*)    Anion gap 17 (*)    All other components within normal limits  BRAIN NATRIURETIC PEPTIDE - Abnormal; Notable for the following:    B Natriuretic Peptide 513.0 (*)    All other components within normal limits  I-STAT CG4 LACTIC ACID, ED - Abnormal; Notable for the following:    Lactic Acid, Venous 10.30 (*)    All other components within normal limits  CULTURE, BLOOD (ROUTINE  X 2)  CULTURE, BLOOD (ROUTINE X 2)  TROPONIN I  URINALYSIS, ROUTINE W REFLEX MICROSCOPIC  BASIC METABOLIC PANEL  I-STAT CG4 LACTIC ACID, ED    EKG  EKG Interpretation  Date/Time:  Wednesday November 09 2016 15:36:07 EDT Ventricular Rate:  128 PR Interval:    QRS Duration: 80 QT Interval:  374 QTC Calculation: 535 R Axis:   3 Text Interpretation:  Atrial fibrillation Prolonged QT interval wavering baseline 2/2 respiratory distress precluedes further evaluation Confirmed by Rolland PorterJames, Sacha Radloff ([id number]) on 11/09/2016 3:41:54 PM       Radiology Dg Chest Port 1 View  Result Date: 11/09/2016 CLINICAL DATA:  Respiratory distress. EXAM: PORTABLE CHEST 1 VIEW COMPARISON:  Radiograph of May 24, 2016. FINDINGS: Stable cardiomediastinal silhouette. Atherosclerosis of thoracic aorta is noted. No pneumothorax is noted. Right lung is clear. Interval development of left perihilar and basilar opacity is noted concerning for pneumonia or possibly edema. Bony thorax is unremarkable. IMPRESSION: Aortic atherosclerosis. Interval development of left perihilar and basilar opacity concerning for pneumonia or possibly edema. Electronically Signed   By: Lupita RaiderJames  Green Jr, M.D.   On: 11/09/2016 15:51    Procedures Procedures (including  critical care time)  Medications Ordered in ED Medications  sodium chloride 0.9 % bolus 1,000 mL (1,000 mLs Intravenous New Bag/Given 11/09/16 1622)    And  sodium chloride 0.9 % bolus 1,000 mL (1,000 mLs Intravenous New Bag/Given 11/09/16 1658)  levofloxacin (LEVAQUIN) IVPB 750 mg (750 mg Intravenous New Bag/Given 11/09/16 1622)  vancomycin (VANCOCIN) IVPB 1000 mg/200 mL premix (1,000 mg Intravenous New Bag/Given 11/09/16 1659)  feeding supplement (ENSURE ENLIVE) (ENSURE ENLIVE) liquid 237 mL (not administered)  sodium chloride 0.9 % bolus 500 mL (0 mLs Intravenous Stopped 11/09/16 1608)     Initial Impression / Assessment and Plan / ED Course  I have reviewed the triage vital signs and the nursing notes.  Pertinent labs & imaging results that were available during my care of the patient were reviewed by me and considered in my medical decision making (see chart for details).   resents in respiratory distress. Tachypneic, tachycardic, hypotensive. We'll give fluids and oxygen area click studies with ABG, EKG, chest x-ray, labs. Await arrival of family to help identify treatment goals.  15:50: I was able to  speak with the patient's daughter/power of attorney who is currently [place]. He normally sees him everyday and helps care for him. She left on Friday. She describes the scenario below:  Patient does get hospice care once per week. He gets morphine and Ativan for dyspnea. They have a 24/7 home health care worker. Apparently after the daughter left on Friday or Saturday the patient had a dispute with the worker who then left. Since that time "family has been trying to check on him". Apparently an aunt, sister, and the girlfriend of a history son have been trying to stay with him. Daughter expresses concern because she states there is "oxycodone, methadone, and morphine in the house". A hospice chaplain was by the house today. Not 6 times before "the girlfriend person" answered. The hospice  chaplain became concerned and called the daughter in [place] stating that he was concerned about "how he is being cared for".  Another family went over to the house and found him "curled up, not breathing well".  His daughter states that under no condition would he want to be placed on a ventilator and the patient reiterates this. Patient, and daughter both state that they would like treatment including  hospitalization, fluids, and antibiotics. He states that he would not want CPR. He declined any invasive procedure such as central lines.  Within these descriptors, and would like treatment until the point that "it isn't helping anymore".  Discussed with his daughter that his lactate is elevated at 10 and that his vital signs rub normal mental is acutely life-threatening ill. I discussed with her that he may not survive this hospitalization. She is returning from [place].   Final Clinical Impressions(s) / ED Diagnoses   Final diagnoses:  COPD exacerbation (HCC)  Respiratory distress  Community acquired pneumonia of left lower lobe of lung (HCC)  Sepsis, due to unspecified organism [facility])  DNR (do not resuscitate)   Patient getting 30 mL/kg fluid. Has had 1 L. Pressure at 90. Continues to mentate well and able to answer questions and interact with staff. Chest x-ray shows new left sided infiltrates. No recent admissions. We'll treat acquired pneumonia. Marked lactic acidosis at 10.  Will discussed with hospitalist regarding admission and overall treatment plans, while honoring patiant and family's wishes/DNR.  CRITICAL CARE Performed by: Rolland Porter JOSEPH   Total critical care time: 30 minutes  Critical care time was exclusive of separately billable procedures and treating other patients.  Critical care was necessary to treat or prevent imminent or life-threatening deterioration.  Critical care was time spent personally by me on the following activities: development of treatment plan with  patient and/or surrogate as well as nursing, discussions with consultants, evaluation of patient's response to treatment, examination of patient, obtaining history from patient or surrogate, ordering and performing treatments and interventions, ordering and review of laboratory studies, ordering and review of radiographic studies, pulse oximetry and re-evaluation of patient's condition.   New Prescriptions New Prescriptions   No medications on file     Rolland Porter, MD 11/09/16 1711    Rolland Porter, MD 11/09/16 [id number]

## 2016-11-09 NOTE — ED Notes (Signed)
Pt given 125 solumedrol in route by EMS

## 2016-11-09 NOTE — ED Notes (Signed)
Pt has a DNR form at bedside

## 2016-11-10 ENCOUNTER — Encounter (HOSPITAL_COMMUNITY): Payer: Self-pay | Admitting: Primary Care

## 2016-11-10 ENCOUNTER — Inpatient Hospital Stay (HOSPITAL_COMMUNITY)

## 2016-11-10 DIAGNOSIS — Z515 Encounter for palliative care: Secondary | ICD-10-CM

## 2016-11-10 DIAGNOSIS — I313 Pericardial effusion (noninflammatory): Secondary | ICD-10-CM

## 2016-11-10 DIAGNOSIS — Z7189 Other specified counseling: Secondary | ICD-10-CM

## 2016-11-10 DIAGNOSIS — J189 Pneumonia, unspecified organism: Secondary | ICD-10-CM

## 2016-11-10 DIAGNOSIS — A419 Sepsis, unspecified organism: Principal | ICD-10-CM

## 2016-11-10 LAB — URINALYSIS, ROUTINE W REFLEX MICROSCOPIC
BILIRUBIN URINE: NEGATIVE
GLUCOSE, UA: NEGATIVE mg/dL
Hgb urine dipstick: NEGATIVE
KETONES UR: NEGATIVE mg/dL
Nitrite: POSITIVE — AB
PH: 5.5 (ref 5.0–8.0)
Protein, ur: NEGATIVE mg/dL
Specific Gravity, Urine: 1.02 (ref 1.005–1.030)

## 2016-11-10 LAB — CBC WITH DIFFERENTIAL/PLATELET
Basophils Absolute: 0 10*3/uL (ref 0.0–0.1)
Basophils Relative: 0 %
EOS ABS: 0 10*3/uL (ref 0.0–0.7)
Eosinophils Relative: 0 %
HCT: 29.6 % — ABNORMAL LOW (ref 39.0–52.0)
Hemoglobin: 9.8 g/dL — ABNORMAL LOW (ref 13.0–17.0)
LYMPHS ABS: 0.4 10*3/uL — AB (ref 0.7–4.0)
Lymphocytes Relative: 7 %
MCH: 35 pg — ABNORMAL HIGH (ref 26.0–34.0)
MCHC: 33.1 g/dL (ref 30.0–36.0)
MCV: 105.7 fL — ABNORMAL HIGH (ref 78.0–100.0)
MONO ABS: 0.2 10*3/uL (ref 0.1–1.0)
Monocytes Relative: 3 %
NEUTROS PCT: 90 %
Neutro Abs: 4.5 10*3/uL (ref 1.7–7.7)
PLATELETS: 317 10*3/uL (ref 150–400)
RBC: 2.8 MIL/uL — AB (ref 4.22–5.81)
RDW: 14.7 % (ref 11.5–15.5)
WBC: 5.1 10*3/uL (ref 4.0–10.5)

## 2016-11-10 LAB — COMPREHENSIVE METABOLIC PANEL
ALT: 14 U/L — AB (ref 17–63)
AST: 26 U/L (ref 15–41)
Albumin: 1.7 g/dL — ABNORMAL LOW (ref 3.5–5.0)
Alkaline Phosphatase: 60 U/L (ref 38–126)
Anion gap: 6 (ref 5–15)
BUN: 32 mg/dL — ABNORMAL HIGH (ref 6–20)
CHLORIDE: 104 mmol/L (ref 101–111)
CO2: 25 mmol/L (ref 22–32)
CREATININE: 0.51 mg/dL — AB (ref 0.61–1.24)
Calcium: 7.6 mg/dL — ABNORMAL LOW (ref 8.9–10.3)
GFR calc non Af Amer: >60 mL/min (ref >60)
Glucose, Bld: 133 mg/dL — ABNORMAL HIGH (ref 65–99)
POTASSIUM: 4.5 mmol/L (ref 3.5–5.1)
SODIUM: 135 mmol/L (ref 135–145)
Total Bilirubin: 0.4 mg/dL (ref 0.3–1.2)
Total Protein: 4.7 g/dL — ABNORMAL LOW (ref 6.5–8.1)

## 2016-11-10 LAB — ECHOCARDIOGRAM COMPLETE
HEIGHTINCHES: 68 in
WEIGHTICAEL: 2148.16 [oz_av]

## 2016-11-10 LAB — URINALYSIS, MICROSCOPIC (REFLEX)
RBC / HPF: NONE SEEN RBC/hpf (ref 0–5)
Squamous Epithelial / LPF: NONE SEEN

## 2016-11-10 LAB — STREP PNEUMONIAE URINARY ANTIGEN: STREP PNEUMO URINARY ANTIGEN: NEGATIVE

## 2016-11-10 LAB — MRSA PCR SCREENING: MRSA by PCR: NEGATIVE

## 2016-11-10 LAB — LACTIC ACID, PLASMA: LACTIC ACID, VENOUS: 1.4 mmol/L (ref 0.5–1.9)

## 2016-11-10 LAB — MAGNESIUM: Magnesium: 1.7 mg/dL (ref 1.7–2.4)

## 2016-11-10 MED ORDER — BISACODYL 10 MG RE SUPP: 10.0000 mg | Freq: Every day | RECTAL | Status: DC | PRN

## 2016-11-10 MED ORDER — ENSURE ENLIVE PO LIQD
237.0000 mL | Freq: Three times a day (TID) | ORAL | Status: DC
  Administered 2016-11-10 – 2016-11-11 (×2): 237 mL via ORAL

## 2016-11-10 MED ORDER — ENOXAPARIN SODIUM 40 MG/0.4ML ~~LOC~~ SOLN: 40.0000 mg | SUBCUTANEOUS | Status: DC

## 2016-11-10 MED ORDER — MORPHINE SULFATE (CONCENTRATE) 10 MG/0.5ML PO SOLN
20.0000 mg | ORAL | Status: DC | PRN
  Administered 2016-11-10 – 2016-11-11 (×4): 20 mg via ORAL
  Filled 2016-11-10 (×4): qty 1

## 2016-11-10 MED ORDER — LORAZEPAM 2 MG/ML IJ SOLN
0.5000 mg | INTRAMUSCULAR | Status: DC | PRN
  Administered 2016-11-10: 0.5 mg via INTRAVENOUS
  Filled 2016-11-10: qty 1

## 2016-11-10 MED ORDER — LEVALBUTEROL HCL 1.25 MG/0.5ML IN NEBU
1.2500 mg | INHALATION_SOLUTION | Freq: Four times a day (QID) | RESPIRATORY_TRACT | Status: DC
  Administered 2016-11-10 – 2016-11-11 (×3): 1.25 mg via RESPIRATORY_TRACT
  Filled 2016-11-10 (×3): qty 0.5

## 2016-11-10 MED ORDER — IPRATROPIUM BROMIDE 0.02 % IN SOLN
0.5000 mg | Freq: Four times a day (QID) | RESPIRATORY_TRACT | Status: DC
  Administered 2016-11-10 – 2016-11-11 (×3): 0.5 mg via RESPIRATORY_TRACT
  Filled 2016-11-10 (×3): qty 2.5

## 2016-11-10 NOTE — Progress Notes (Signed)
PROGRESS NOTE    John Chambers  ZOX:[id number] DOB: 1927-04-08 DOA: 11/09/2016 PCP: Isabella Bowens, MD     Brief Narrative:  83 year old man admitted from home on 7/25 due to shortness of breath. He was found to have pneumonia and sepsis and admission was requested. It is important to note that he is a hospice patient and is currently undergoing home hospice care will stop   Assessment & Plan:   Principal Problem:   CAP (community acquired pneumonia) Active Problems:   HTN (hypertension)   Hyperlipidemia   COPD (chronic obstructive pulmonary disease) (HCC)   GERD (gastroesophageal reflux disease)   Hyponatremia   Anemia   BPH (benign prostatic hyperplasia)   Sepsis (HCC)   Atrial fibrillation with RVR (HCC)   Palliative care encounter   Goals of care, counseling/discussion   Encounter for hospice care discussion   Sepsis/acute hypoxemic respiratory failure due to community-acquired pneumonia -Sepsis parameters have improved. -Was initially placed on vancomycin and Levaquin, will discontinue vancomycin given negative MRSA PCR. -Culture data is in process. -Palliative care consultation has been requested. If family wishes to proceed with hospice care, I believe he would be appropriate for residential hospice at this point. -Still requiring a nonrebreather.  A. fib with RVR -Rates are currently better controlled, not candidate for anticoagulation given his debilitated state and the fact that he is a hospice patient.  Hyperlipidemia -Lipitor is on hold.  BPH -Flomax on hold due to hypotension      DVT prophylaxis: None given palliative care status Code Status: DNR Family Communication: patient only Disposition Plan: pending palliative care evaluation, but suspect residential hospice to be the best venue for him at this point.  Consultants:   Palliative care  Procedures:   ECHO pending  Antimicrobials:  Anti-infectives    Start     Dose/Rate  Route Frequency Ordered Stop   11/10/16 0600  vancomycin (VANCOCIN) 500 mg in sodium chloride 0.9 % 100 mL IVPB  Status:  Discontinued     500 mg 100 mL/hr over 60 Minutes Intravenous Every 12 hours 11/09/16 1959 11/10/16 1006   11/09/16 2000  levofloxacin (LEVAQUIN) IVPB 750 mg  Status:  Discontinued     750 mg 100 mL/hr over 90 Minutes Intravenous Every 24 hours 11/09/16 1959 11/10/16 1502   11/09/16 1615  levofloxacin (LEVAQUIN) IVPB 750 mg     750 mg 100 mL/hr over 90 Minutes Intravenous  Once 11/09/16 1602 11/09/16 1743   11/09/16 1615  vancomycin (VANCOCIN) IVPB 1000 mg/200 mL premix     1,000 mg 200 mL/hr over 60 Minutes Intravenous  Once 11/09/16 1602 11/09/16 1817       Subjective: Unresponsive, lying in bed, no famly at bedside  Objective: Vitals:   11/10/16 1111 11/10/16 1200 11/10/16 1300 11/10/16 1459  BP:  98/68 126/83   Pulse: (!) 110 (!) 101 (!) 112   Resp: 15 15 19    Temp: 97.9 F (36.6 C)     TempSrc: Axillary     SpO2: 100% 100% 99% 100%  Weight:      Height:        Intake/Output Summary (Last 24 hours) at 11/10/16 1550 Last data filed at 11/10/16 1100  Gross per 24 hour  Intake             1125 ml  Output              250 ml  Net  875 ml   Filed Weights   11/09/16 1613 11/09/16 1959 11/10/16 0500  Weight: 62.3 kg (137 lb 5.6 oz) 60.9 kg (134 lb 4.2 oz) 60.9 kg (134 lb 4.2 oz)    Examination:  General exam: unresponsive Respiratory system: Coarse bilateral breath sounds. Cardiovascular system:tachy, irregular Gastrointestinal system: Abdomen is nondistended, soft and nontender. No organomegaly or masses felt. Normal bowel sounds heard. Central nervous system: Unable to assess given current mental state Extremities: No C/C/E, +pedal pulses Skin: No rashes, lesions or ulcers Psychiatry: Unable to assess given current mental state.    Data Reviewed: I have personally reviewed following labs and imaging studies  CBC:  Recent  Labs Lab 11/09/16 1532 11/10/16 0519  WBC 13.6* 5.1  NEUTROABS 12.3* 4.5  HGB 12.1* 9.8*  HCT 36.7* 29.6*  MCV 106.1* 105.7*  PLT 394 317   Basic Metabolic Panel:  Recent Labs Lab 11/09/16 1532 11/09/16 1745 11/10/16 0519  NA 132*  --  135  K 4.5  --  4.5  CL 94*  --  104  CO2 21*  --  25  GLUCOSE 125*  --  133*  BUN 26*  --  32*  CREATININE 0.85  --  0.51*  CALCIUM 8.2*  --  7.6*  MG  --  1.4* 1.7  PHOS  --  5.6*  --    GFR: Estimated Creatinine Clearance: 56 mL/min (A) (by C-G formula based on SCr of 0.51 mg/dL (L)). Liver Function Tests:  Recent Labs Lab 11/10/16 0519  AST 26  ALT 14*  ALKPHOS 60  BILITOT 0.4  PROT 4.7*  ALBUMIN 1.7*   No results for input(s): LIPASE, AMYLASE in the last 168 hours. No results for input(s): AMMONIA in the last 168 hours. Coagulation Profile: No results for input(s): INR, PROTIME in the last 168 hours. Cardiac Enzymes:  Recent Labs Lab 11/09/16 1532  TROPONINI <0.03   BNP (last 3 results) No results for input(s): PROBNP in the last 8760 hours. HbA1C: No results for input(s): HGBA1C in the last 72 hours. CBG: No results for input(s): GLUCAP in the last 168 hours. Lipid Profile: No results for input(s): CHOL, HDL, LDLCALC, TRIG, CHOLHDL, LDLDIRECT in the last 72 hours. Thyroid Function Tests: No results for input(s): TSH, T4TOTAL, FREET4, T3FREE, THYROIDAB in the last 72 hours. Anemia Panel: No results for input(s): VITAMINB12, FOLATE, FERRITIN, TIBC, IRON, RETICCTPCT in the last 72 hours. Urine analysis:    Component Value Date/Time   COLORURINE YELLOW 11/10/2016 0000   APPEARANCEUR CLEAR 11/10/2016 0000   LABSPEC 1.020 11/10/2016 0000   PHURINE 5.5 11/10/2016 0000   GLUCOSEU NEGATIVE 11/10/2016 0000   HGBUR NEGATIVE 11/10/2016 0000   BILIRUBINUR NEGATIVE 11/10/2016 0000   KETONESUR NEGATIVE 11/10/2016 0000   PROTEINUR NEGATIVE 11/10/2016 0000   UROBILINOGEN 0.2 10/13/2014 1610   NITRITE POSITIVE (A)  11/10/2016 0000   LEUKOCYTESUR TRACE (A) 11/10/2016 0000   Sepsis Labs: @LABRCNTIP (procalcitonin:4,lacticidven:4)  ) Recent Results (from the past 240 hour(s))  Blood Culture (routine x 2)     Status: None (Preliminary result)   Collection Time: 11/09/16  4:16 PM  Result Value Ref Range Status   Specimen Description LEFT ANTECUBITAL  Final   Special Requests   Final    BOTTLES DRAWN AEROBIC AND ANAEROBIC Blood Culture results may not be optimal due to an inadequate volume of blood received in culture bottles   Culture NO GROWTH < 24 HOURS  Final   Report Status PENDING  Incomplete  Blood  Culture (routine x 2)     Status: None (Preliminary result)   Collection Time: 11/09/16  4:16 PM  Result Value Ref Range Status   Specimen Description BLOOD LEFT WRIST  Final   Special Requests   Final    BOTTLES DRAWN AEROBIC AND ANAEROBIC Blood Culture adequate volume   Culture NO GROWTH < 24 HOURS  Final   Report Status PENDING  Incomplete  MRSA PCR Screening     Status: None   Collection Time: 11/10/16 12:00 AM  Result Value Ref Range Status   MRSA by PCR NEGATIVE NEGATIVE Final    Comment:        The GeneXpert MRSA Assay (FDA approved for NASAL specimens only), is one component of a comprehensive MRSA colonization surveillance program. It is not intended to diagnose MRSA infection nor to guide or monitor treatment for MRSA infections.          Radiology Studies: Dg Chest Port 1 View  Result Date: 11/09/2016 CLINICAL DATA:  Respiratory distress. EXAM: PORTABLE CHEST 1 VIEW COMPARISON:  Radiograph of May 24, 2016. FINDINGS: Stable cardiomediastinal silhouette. Atherosclerosis of thoracic aorta is noted. No pneumothorax is noted. Right lung is clear. Interval development of left perihilar and basilar opacity is noted concerning for pneumonia or possibly edema. Bony thorax is unremarkable. IMPRESSION: Aortic atherosclerosis. Interval development of left perihilar and basilar  opacity concerning for pneumonia or possibly edema. Electronically Signed   By: Lupita RaiderJames  Green Jr, M.D.   On: 11/09/2016 15:51        Scheduled Meds: . feeding supplement (ENSURE ENLIVE)  237 mL Oral TID BM  . ipratropium  0.5 mg Nebulization Q6H  . levalbuterol  1.25 mg Nebulization Q6H  . mirtazapine  30 mg Oral QHS   Continuous Infusions: . sodium chloride 75 mL/hr at 11/10/16 1100     LOS: 1 day    Time spent: 35 minutes. Greater than 50% of this time was spent in direct contact with the patient coordinating care.     Chaya JanHERNANDEZ ACOSTA,ESTELA, MD [facility] Pager [phone]  If 7PM-7AM, please contact night-coverage [email or web address] Password [email or web address] 11/10/2016, 3:50 PM

## 2016-11-10 NOTE — Progress Notes (Signed)
Pharmacy Antibiotic Note  John Chambers is a 83 y.o. male admitted on 11/09/2016 with pneumonia.  [facility]  Plan: Levaquin 750mg  IV q24hrs Monitor labs, progress, c/s  Height: 5\' 8"  (172.7 cm) Weight: 134 lb 4.2 oz (60.9 kg) IBW/kg (Calculated) : 68.4  Temp (24hrs), Avg:97.8 F (36.6 C), Min:97.3 F (36.3 C), Max:98.8 F (37.1 C)   Recent Labs Lab 11/09/16 1532 11/09/16 1544 11/09/16 1905 11/09/16 1916 11/09/16 2317 11/10/16 0519  WBC 13.6*  --   --   --   --  5.1  CREATININE 0.85  --   --   --   --  0.51*  LATICACIDVEN  --  10.30* 7.9* 3.89* 2.4* 1.4    Estimated Creatinine Clearance: 56 mL/min (A) (by C-G formula based on SCr of 0.51 mg/dL (L)).    Allergies  Allergen Reactions  . Milk-Related Compounds Other (See Comments)    Gi UPSET "SOMETIMES"  . Salicylates   . Aspirin Itching, Swelling, Rash and Other (See Comments)    GI upset  . Penicillins Rash   Antimicrobials this admission: Vancomycin 7/25 >> 7/26 Levaquin 7/25 >>   Dose adjustments this admission:  Microbiology results: 7/25  BCx: pending  UCx: pending   Sputum: pending  7/26 MRSA PCR: negative  Thank you for allowing pharmacy to be a part of this patient's care.  Valrie HartHall, Ardell Aaronson A 11/10/2016 12:26 PM

## 2016-11-10 NOTE — Progress Notes (Signed)
Nutrition Brief Note  Patient identified on the Malnutrition Screening Tool (MST) Report as he had reported poor appetite/wt loss.   However. It is noted he is a hospice patient. As such, full assessment not warranted as goal is comfort w/o aggressive interventions.   Met with patient and family and stated RD there was to make sure he was comfortable as regards to food. RD offered to take food requests. Surprisingly, the patient asks for Ensure TID. Will order. Other food requests noted and passed to dietary ambassadors.   No nutrition interventions warranted at this time. If nutrition issues arise, please consult RD.   Burtis Junes RD, LDN, CNSC Clinical Nutrition Pager: [phone] 11/10/2016 2:05 PM

## 2016-11-10 NOTE — Progress Notes (Signed)
*  PRELIMINARY RESULTS* Echocardiogram 2D Echocardiogram has been performed.  John Chambers, [name] 11/10/2016, 9:59 AM

## 2016-11-10 NOTE — Progress Notes (Signed)
LCSW made aware of need for hospice home referral   [facility] placed to referrals and faxed referral  Currently there is a wait list at hospice home per referrals liaison.  Will follow up once referral reviewed and pending bed status.  John EmoryHannah Dorma Altman LCSW, MSW Clinical Social Work: Optician, dispensingystem [employer] Coverage for :  [phone]

## 2016-11-10 NOTE — Consult Note (Signed)
Consultation Note Date: 11/10/2016   Patient Name: John Chambers  DOB: [birth year]  MRN: [id number]  Age / Sex: 83 y.o., male  PCP: John Bowens, MD Referring Physician: Philip Chambers, John Chambers*  Reason for Consultation: Establishing goals of care and Psychosocial/spiritual support  HPI/Patient Profile: 83 y.o. male  with past medical history of End-stage COPD 3 L oxygen dependent (active with [facility], admitted May 28, 2016, in 2nd benefit term), anemia, BPH, hypertension and high cholesterol, chronic insomnia admitted on 11/09/2016 with community acquired pneumonia with sepsis due to pneumonia.   Clinical Assessment and Goals of Care: John Chambers is resting quietly in bed. He will briefly make eye contact when I speak with him. He is very difficult to understand, and seems somewhat confused. I ask if he is thirsty, and he asks for ensure. He is able to draw on the straw when I put it in his mouth, but not hard enough to take any liquids. He seems weak and confused, but calm and cooperative at this time.  Call to [facility], spoke with John Chambers, she states that John Chambers is not a patient with their hospice.  Call to daughter, John Chambers at [phone]. John Chambers states that she and her sister John Chambers are on the way to the hospital. We agreed to have a family meeting in about 30 minutes. John Chambers states that John Chambers is with [facility].  Call to [facility] hospice rest presented to [facility]. He states that he will have his director call me. Phone call from John Chambers who shares that John Chambers was accepted to their hospice services May 28, 2016. She states that there's been a dramatic change in his palliative performance scale, he is now 40%, able to transfer only. She states that he is in his 2nd benefit term, they will accept him back if he returns home, or pay for services in a SNF. John Chambers  states that they are also understanding if John Chambers and his family would benefit from another inpatient hospice.  Family meeting with daughter/HC POA, John Chambers and her sister John Chambers. They tell of John Chambers dissatisfaction with his life over the last few years. He lost his wife 8 years ago, and he shares that "all my friends have died". They share that T has become angry and verbally abusive. They share that they struggle to find caregivers, but have private care  staff 24/7.    We talk about what's next for John Chambers. I share my worries about his frailty, functional status, his irreversible and terminal illness of end-stage COPD. We talk about the concept of allowing natural death, and the concept of let nature take its course. I share that the medical team is aware that people have moral distress with not continuing treatment, and that often I will encourage families to not stop midstream; but, John Chambers is very ill, and is not likely to be able to return to any quality of life after this illness. I ask daughter John Chambers where she sees her father  going next. She states that she feels hospice home of [place] would be a good choice. I share with family that the medical team feels that hospice home would be a good choice for John Chambers.    daughter/HCPOA, John Chambers and her sister John Chambers are requesting full comfort care, transfer to hospice home of [facility]. They understand that there will be no lab draws, no IV fluids, no antibiotics. We discussed the concept of let nature take its course. With permission, we talk about prognosis, which is likely less than 2 weeks.   Healthcare power of attorney HCPOA - daughter John Chambers tells me that she has legal healthcare power of attorney.   SUMMARY OF RECOMMENDATIONS   daughter/HCPOA, John Chambers and her sister John Chambers are requesting full comfort care, transfer to hospice home of [facility]. They understand that there will be no lab draws, no IV  fluids, no antibiotics. We discussed the concept of let nature take its course. With permission, we talk about prognosis, which is likely less than 2 weeks.  Code Status/Advance Care Planning:  DNR  Symptom Management:   per hospitalist  per hospice protocol upon discharge  recommend Ativan 0.5 mg sublingual BID  recommend morphine 5-10 milligrams PO/sublingual Q4 hours scheduled and morphine 5 mg Q 30 minutes as needed for pain or breathlessness.  Palliative Prophylaxis:   Aspiration and Turn Reposition  Additional Recommendations (Limitations, Scope, Preferences):  Full Comfort Care  Psycho-social/Spiritual:   Desire for further Chaplaincy support:no  Additional Recommendations: Caregiving  Support/Resources and Education on Hospice  Prognosis:   < 2 weeks, likely d/t end stage COPD and family desire to let nature take its course.  Discharge Planning: Family is requesting hospice home placement in [place]      Primary Diagnoses: Present on Admission: . CAP (community acquired pneumonia) . HTN (hypertension) . Hyperlipidemia . COPD (chronic obstructive pulmonary disease) (HCC) . GERD (gastroesophageal reflux disease) . Hyponatremia . Anemia . BPH (benign prostatic hyperplasia) . Sepsis due to pneumonia (HCC) . Atrial fibrillation with RVR (HCC)   I have reviewed the medical record, interviewed the patient and family, and examined the patient. The following aspects are pertinent.  Past Medical History:  Diagnosis Date  . Anemia 08/06/2011  . BPH (benign prostatic hyperplasia)   . Chronic insomnia   . COPD (chronic obstructive pulmonary disease) (HCC)   . GERD (gastroesophageal reflux disease)   . Hypercholesterolemia   . Hypertension    Social History   Social History  . Marital status: Widowed    Spouse name: N/A  . Number of children: N/A  . Years of education: N/A   Social History Main Topics  . Smoking status: Former Smoker    Packs/day:  0.25    Years: 55.00    Types: Cigarettes  . Smokeless tobacco: Never Used  . Alcohol use No     Comment: pt says he usually drinks "a little" before he eats.  . Drug use: No  . Sexual activity: No   Other Topics Concern  . None   Social History Narrative  . None   Family History  Problem Relation Age of Onset  . Hypertension Other    Scheduled Meds: . enoxaparin (LOVENOX) injection  40 mg Subcutaneous Q24H  . escitalopram  10 mg Oral Daily  . feeding supplement (ENSURE ENLIVE)  237 mL Oral BID BM  . ipratropium  0.5 mg Nebulization Q6H  . levalbuterol  1.25 mg Nebulization Q6H  . mirtazapine  30 mg Oral QHS   Continuous Infusions: . sodium chloride 75 mL/hr at 11/10/16 1100  . levofloxacin (LEVAQUIN) IV     PRN Meds:.acetaminophen, levalbuterol, LORazepam, ondansetron **OR** ondansetron (ZOFRAN) IV, oxymetazoline, senna Medications Prior to Admission:  Prior to Admission medications   Medication Sig Start Date End Date Taking? Authorizing Provider  acetaminophen (TYLENOL) 500 MG tablet Take 500-1,000 mg by mouth every 6 (six) hours as needed. Pain   Yes [provider]  amitriptyline (ELAVIL) 10 MG tablet Take 10 mg by mouth at bedtime as needed for sleep (May take in adition to 25mg  if needed).   Yes [provider]  amitriptyline (ELAVIL) 25 MG tablet Take 25 mg by mouth at bedtime. Take 25mg  at bedtime; may take an additional 10mg  tablet if needed   Yes [provider]  Infant Care Products (DERMACLOUD) CREA APPLY EXTERNALLY AS NEEDED/AS DIRECTED 05/17/16  Yes [provider]  ipratropium-albuterol (DUONEB) 0.5-2.5 (3) MG/3ML SOLN USE 1 VIAL IN NEBULIZER TWICE DAILY AS DIRECTED 04/27/16  Yes [provider]  methadone (DOLOPHINE) 5 MG tablet Take one-half tablet by mouth three times daily 09/17/14  Yes [provider]  morphine (ROXANOL) 20 MG/ML concentrated solution Take 10 mg by mouth every 2 (two) hours as needed for  severe pain or shortness of breath.   Yes [provider]  oxyCODONE-acetaminophen (PERCOCET/ROXICET) 5-325 MG per tablet Take 1 tablet by mouth every 8 (eight) hours as needed for severe pain. 06/14/14  Yes Zadie RhineWickline, Donald, MD  oxymetazoline (NASAL SPRAY 12 HOUR) 0.05 % nasal spray Place 1 spray into both nostrils 2 (two) times daily as needed for congestion.   Yes [provider]  SPIRIVA RESPIMAT 1.25 MCG/ACT AERS Inhale 1 puff into the lungs daily.  03/24/16  Yes [provider]  amLODipine (NORVASC) 10 MG tablet Take 10 mg by mouth daily. 08/06/14   [provider]  atorvastatin (LIPITOR) 10 MG tablet Take 10 mg by mouth every evening.     [provider]  escitalopram (LEXAPRO) 10 MG tablet Take 10 mg by mouth daily.  03/01/16   [provider]  mirtazapine (REMERON) 30 MG tablet Take 30 mg by mouth at bedtime.  03/23/16   [provider]  senna (SENOKOT) 8.6 MG TABS tablet Take 1-2 tablets by mouth daily as needed for mild constipation.    [provider]  spironolactone (ALDACTONE) 25 MG tablet Take 25-50 mg by mouth daily as needed.    [provider]  tamsulosin (FLOMAX) 0.4 MG CAPS capsule Take 0.4 mg by mouth daily.  03/23/16   [provider]   Allergies  Allergen Reactions  . Milk-Related Compounds Other (See Comments)    Gi UPSET "SOMETIMES"  . Salicylates   . Aspirin Itching, Swelling, Rash and Other (See Comments)    GI upset  . Penicillins Rash   Review of Systems  Unable to perform ROS: Acuity of condition    Physical Exam  Constitutional: No distress.  HENT:  Head: Atraumatic.  Cardiovascular:  Rate low 100s  Pulmonary/Chest: No respiratory distress.  100% non rebreather, D Sats when transition to 6 L nasal cannula  Abdominal: Soft. He exhibits no distension.  Musculoskeletal: He exhibits no edema.  Legs contracted  Neurological: He is alert.  Difficult to understand, knows  name  Skin: Skin is warm and dry.  Nursing note and vitals reviewed.   Vital Signs: BP 107/74   Pulse (!) 110   Temp 97.9  F (36.6 C) (Axillary)   Resp 15   Ht 5\' 8"  (1.727 m)   Wt 60.9 kg (134 lb 4.2 oz)   SpO2 100%   BMI 20.41 kg/m  Pain Assessment: No/denies pain       SpO2: SpO2: 100 % O2 Device:SpO2: 100 % O2 Flow Rate: .O2 Flow Rate (L/min): 14 L/min  IO: Intake/output summary:  Intake/Output Summary (Last 24 hours) at 11/10/16 1211 Last data filed at 11/10/16 1100  Gross per 24 hour  Intake             1125 ml  Output              250 ml  Net              875 ml    LBM: Last BM Date: 11/08/16 (per family) Baseline Weight: Weight: 62.3 kg (137 lb 5.6 oz) Most recent weight: Weight: 60.9 kg (134 lb 4.2 oz)     Palliative Assessment/Data:   Flowsheet Rows     Most Recent Value  Intake Tab  Referral Department  Hospitalist  Unit at Time of Referral  ICU  Palliative Care Primary Diagnosis  Pulmonary  Date Notified  11/10/16  Palliative Care Type  New Palliative care  Reason for referral  Clarify Goals of Care  Date of Admission  11/09/16  Date first seen by Palliative Care  11/10/16  # of days Palliative referral response time  0 Day(s)  # of days IP prior to Palliative referral  1  Clinical Assessment  Palliative Performance Scale Score  30%  Pain Max last 24 hours  Not able to report  Pain Min Last 24 hours  Not able to report  Dyspnea Max Last 24 Hours  Not able to report  Dyspnea Min Last 24 hours  Not able to report  Psychosocial & Spiritual Assessment  Palliative Care Outcomes  Patient/Family meeting held?  Yes  Who was at the meeting?  Daughters John StanleyLisa Moore/HCPOA and [place] CreekSandra.  Palliative Care Outcomes  Counseled regarding hospice, Provided advance care planning, Clarified goals of care  Patient/Family wishes: Interventions discontinued/not started   Mechanical Ventilation      Time In:     0910 and 1310 Time Out:  0930 and 1420 Time Total:   20 + 70 = 90 minutes Greater than 50%  of this time was spent counseling and coordinating care related to the above assessment and plan.  Signed by: Katheran Aweasha A Dove, NP   Please contact Palliative Medicine Team phone at [phone] for questions and concerns.  For individual provider: See Loretha StaplerAmion

## 2016-11-11 DIAGNOSIS — L8990 Pressure ulcer of unspecified site, unspecified stage: Secondary | ICD-10-CM | POA: Insufficient documentation

## 2016-11-11 NOTE — Clinical Social Work Note (Signed)
Patient Information   Patient Name John Chambers, John Chambers ([id number]) Sex Male DOB 01/10/1927 SSN [id number]  Room Bed  IC10 IC10-01  Patient Demographics   Address [address] [place] [address] Phone [phone] (Home)  Patient Ethnicity & Race   Ethnic Group Patient Race  Not Hispanic or Latino White or Caucasian  Emergency Contact(s)   Name Relation Home Work Mobile  McKinney,Abby Grandaughter [phone]  [facility] Complex[facility]ore,Lisa Daughter [phone]  [phone]  Ernestina PatchesGenevicz,Sandra Daughter   [phone]  Documents on File    Status Date Received Description  Documents for the Patient  EMR Medication Summary Not Received    [place] HIPAA NOTICE OF PRIVACY - Scanned Received 09/14/11   [place] E-Signature HIPAA Notice of Privacy Not Received    [place] E-Signature HIPAA Notice of Privacy Spanish Not Received    Driver's License Not Received    Advance Directives/Living Will/HCPOA/POA Not Received    Driver's License Not Received    [place] HIPAA NOTICE OF PRIVACY - Scanned Not Received    EMR Problem Summary Not Received    EMR Patient Summary Not Received    Insurance Card Received 09/14/11   Financial Application Not Received    Insurance Card Not Received    HIM ROI Authorization Not Received    Release of Information Not Received    Advance Directives/Living Will/HCPOA/POA Not Received    Advance Directives/Living Will/HCPOA/POA Not Received    [place] HIPAA NOTICE OF PRIVACY - Scanned Not Received    Insurance Card Not Received    AMB Correspondence Not Received  06/13 ortho [facility] \\Chambers \ A  AMB Correspondence Not Received  06/13 d/c [facility]  Other Photo ID Not Received    Advance Directives/Living Will/HCPOA/POA (Deleted) 09/01/11   Advance Directives/Living Will/HCPOA/POA (Deleted) 09/01/11   Documents for the Encounter  AOB (Assignment of Insurance Benefits) Received 11/09/16 Patient unable to  sign  E-signature AOB     MEDICARE RIGHTS Received 11/09/16 Patient unable to sign  E-signature Medicare Rights     EMS Run Sheet  11/09/16   ED Patient Billing Extract   ED PB Summary  Cardiac Monitoring Strip Shift Summary  11/09/16   EMS Run Sheet  11/10/16   EMS Run Sheet  11/10/16   EKG  11/10/16   Admission Information   Attending Provider Admitting Provider Admission Type Admission Date/Time  Rolly SalterPatel, Pranav M, MD Bobette Mortiz, David Manuel, MD Emergency 11/09/16 1523  Discharge Date Hospital Service Auth/Cert Status Service Area   Internal Medicine Incomplete [place] SERVICE AREA  Unit Room/Bed Admission Status   AP-ICCUP NURSING IC10/IC10-01 Admission (Confirmed)   Admission   Complaint  respiratory distress  Hospital Account   Name Acct ID Class Status Primary Coverage  John Chambers, John Chambers [id number] Inpatient Open MEDICARE - MEDICARE PART A AND B      Guarantor Account (for Hospital Account [id number])   Name Relation to Pt Service Area Active? Acct Type  Alvira PhilipsKing, Revin Chambers Self CHSA Yes Personal/Family  Address Phone    [address] [place], [place] [address] [phone](H)        Coverage Information (for Hospital Account [id number])   1. MEDICARE/MEDICARE PART A AND B   F/O Payor/Plan Precert #  MEDICARE/MEDICARE PART A AND B   Subscriber Subscriber #  John Chambers, John Chambers [id number]  Address Phone  [address] [place] [phone]   2. BLUE CROSS BLUE SHIELD/BCBS/FEDERAL EMP PPO   F/O Payor/Plan Precert #  [facility]/BCBS/FEDERAL EMP PPO   Subscriber Subscriber #  John Chambers, John Chambers [id number]  Address Phone  [address] [place], [place] [address] [phone]

## 2016-11-11 NOTE — Progress Notes (Signed)
[facility]. Family aware. PIV's removed; no complications.

## 2016-11-11 NOTE — Care Management (Signed)
Abby Mckinney-granddaughter called and states she would like [facility]. CM notified Wandra MannanZack Brooks, AD of CSW department. Someone will f/u with family and referral.

## 2016-11-11 NOTE — Care Management (Signed)
Medical Necessity completed. RN will call EMS if patient is [facility].

## 2016-11-11 NOTE — Clinical Social Work Note (Signed)
LCSW met with patient's daughters who were at bedside. Discussion was held related to discharge plan. Family feels that hospice home is the most appropriate discharge plan as they do not feel that could provide adequate care for patient in the home.  [facility] Home with [facility] has no beds currently (per Hilda Blades at facility).  Patient is one of three on waiting list. Family is agreeable to [facility]. LCSW spoke with Jenny Reichmann at the facility. One bed is available. LCSW faxed referrals clinicals to [facility] at [facility].      Astella Desir, Clydene Pugh, LCSW

## 2016-11-11 NOTE — Consult Note (Signed)
[facility] Liaison  Received request from CSW [place]essa for [facility]. G-dtr Romeo AppleAbby McKinney also [facility] to [facility] earlier to day requesting placement for her g-father. Chart is currently under review. [facility] has family and hospital contact information for follow up.  Thank you,  Forrestine Himva Davis, LCSW [phone]

## 2016-11-11 NOTE — Progress Notes (Signed)
[facility] Progress Note  Patient: John Chambers ZOX:[id number]   PCP: Isabella BowensVasireddy, Venugopal Kiran, MD DOB: 11/16/1926   DOA: 11/09/2016   DOS: 11/11/2016   Date of Service: the patient was seen and examined on 11/11/2016  Subjective: Resting comfortably. No acute events overnight.  Brief hospital course: Pt. with PMH of and states COPD, chronic respiratory failure, on hospice, anemia, BPH, hypertension; admitted on 11/09/2016, presented with complaint of shortness of breath, was found to be having severe sepsis due to pneumonia Discussion with the family the patient will transition to complete comfort care Currently further plan is continue comfort measures and arrange inpatient hospice.  Assessment and Plan:   CAP (community acquired pneumonia)   Sepsis due to pneumonia [facility](HCC) Goals of care discussion Initially started on septic protocol. Given IV vancomycin and Levaquin. Vancomycin was later on d discontinued. BiPAP when necessary was also ordered but did not require. Palliative care was consulted due to poor prognosis. Currently the patient is lethargic, not following command. But remains comfortable. Continue comfort measures at present. Awaiting arrangement for inpatient hospice.  Active Problems:   COPD (chronic obstructive pulmonary disease) (HCC) Continue supplemental oxygen. Continue duo nebs every 6 hours. Albuterol nebs every 4 hours as needed.    HTN (hypertension) Hold antihypertensives and diuretics for now. Monitor blood pressure.    Hyperlipidemia Hold atorvastatin for now.    Atrial fibrillation with RVR (HCC) CHA2DS2-VASc Score of at least 3. Not on anticoagulation for medications for rate control.    GERD (gastroesophageal reflux disease)   Hyponatremia   Anemia   BPH (benign prostatic hyperplasia)  Diet: comfort feeds  Advance goals of care discussion: comfort care  Family Communication: no family was present at bedside, at the time of  interview.  Disposition:  Discharge to inpatient hospice .  Consultants: palliative care Procedures: none  Antibiotics: Anti-infectives    Start     Dose/Rate Route Frequency Ordered Stop   11/10/16 0600  vancomycin (VANCOCIN) 500 mg in sodium chloride 0.9 % 100 mL IVPB  Status:  Discontinued     500 mg 100 mL/hr over 60 Minutes Intravenous Every 12 hours 11/09/16 1959 11/10/16 1006   11/09/16 2000  levofloxacin (LEVAQUIN) IVPB 750 mg  Status:  Discontinued     750 mg 100 mL/hr over 90 Minutes Intravenous Every 24 hours 11/09/16 1959 11/10/16 1502   11/09/16 1615  levofloxacin (LEVAQUIN) IVPB 750 mg     750 mg 100 mL/hr over 90 Minutes Intravenous  Once 11/09/16 1602 11/09/16 1743   11/09/16 1615  vancomycin (VANCOCIN) IVPB 1000 mg/200 mL premix     1,000 mg 200 mL/hr over 60 Minutes Intravenous  Once 11/09/16 1602 11/09/16 1817       Objective: Physical Exam: Vitals:   11/10/16 2000 11/10/16 2015 11/10/16 2045 11/11/16 0827  BP:      Pulse: (!) 124 (!) 120    Resp: (!) 24 18    Temp:      TempSrc:      SpO2: (!) 80% (!) 81% 92% (!) 86%  Weight:      Height:        Intake/Output Summary (Last 24 hours) at 11/11/16 1343 Last data filed at 11/11/16 0900  Gross per 24 hour  Intake          1708.75 ml  Output              950 ml  Net  758.75 ml   Filed Weights   11/09/16 1613 11/09/16 1959 11/10/16 0500  Weight: 62.3 kg (137 lb 5.6 oz) 60.9 kg (134 lb 4.2 oz) 60.9 kg (134 lb 4.2 oz)   General: Drowsy and lethargic Appear in mild distress, unable to follow command Cardiovascular: S1 and S2 Present, no Murmur,  Respiratory: increased respiratory effort, Bilateral Air entry equal and Decreased, no use of accessory muscle, bilateral Crackles, no wheezes Abdomen: Bowel Sound present,  Data Reviewed: CBC:  Recent Labs Lab 11/09/16 1532 11/10/16 0519  WBC 13.6* 5.1  NEUTROABS 12.3* 4.5  HGB 12.1* 9.8*  HCT 36.7* 29.6*  MCV 106.1* 105.7*  PLT 394 317    Basic Metabolic Panel:  Recent Labs Lab 11/09/16 1532 11/09/16 1745 11/10/16 0519  NA 132*  --  135  K 4.5  --  4.5  CL 94*  --  104  CO2 21*  --  25  GLUCOSE 125*  --  133*  BUN 26*  --  32*  CREATININE 0.85  --  0.51*  CALCIUM 8.2*  --  7.6*  MG  --  1.4* 1.7  PHOS  --  5.6*  --     Liver Function Tests:  Recent Labs Lab 11/10/16 0519  AST 26  ALT 14*  ALKPHOS 60  BILITOT 0.4  PROT 4.7*  ALBUMIN 1.7*   No results for input(s): LIPASE, AMYLASE in the last 168 hours. No results for input(s): AMMONIA in the last 168 hours. Coagulation Profile: No results for input(s): INR, PROTIME in the last 168 hours. Cardiac Enzymes:  Recent Labs Lab 11/09/16 1532  TROPONINI <0.03   BNP (last 3 results) No results for input(s): PROBNP in the last 8760 hours. CBG: No results for input(s): GLUCAP in the last 168 hours. Studies: No results found.  Scheduled Meds: . feeding supplement (ENSURE ENLIVE)  237 mL Oral TID BM  . ipratropium  0.5 mg Nebulization Q6H WA  . levalbuterol  1.25 mg Nebulization Q6H WA  . mirtazapine  30 mg Oral QHS   Continuous Infusions: PRN Meds: acetaminophen, bisacodyl, levalbuterol, LORazepam, morphine CONCENTRATE, ondansetron **OR** ondansetron (ZOFRAN) IV, oxymetazoline, senna  Time spent: 25 minutes  Author: Lynden OxfordPranav Deigo Alonso, MD [facility] Pager: [phone] 11/11/2016 1:43 PM  If 7PM-7AM, please contact night-coverage at [email or web address], password [email or web address]

## 2016-11-11 NOTE — Clinical Social Work Note (Signed)
LCSW spoke with Arline Aspindy at [facility]. She advised that patient could not be admitted today because he would require a face to face. Patient would have to have a face to face and their doctor could not see patient until Monday. She advised that this was necessary because patient revoke in his 2nd benefit with [facility] and this would be his third benefit and that no hospice could open him for even in residence hospice without a face to face due to this being his third benefit.    Rexann Lueras, Juleen ChinaHeather D, LCSW

## 2016-11-11 NOTE — Clinical Social Work Note (Signed)
CSW received consult form assistant social work Interior and spatial designerdirector, Wandra MannanZack Brooks regarding residential hospice placement for patient and selection of [facility]. Call made to Forrestine HimEva Davis, CSW with [facility] and referral made and Carley Hammedva expressed awareness of patient.  CSW talked later with Carley HammedEva (4:46 pm) and was informed that they can take patient was attempts were being made to contact family for paperwork to be completed. Carley Hammedva indicated that she would get [facility].   CSW reviewed nursing note indicating that patient is being [facility] this [facility] and transport arranged.  Genelle BalVanessa Emily Forse, MSW, LCSW Licensed Clinical Social Worker Clinical Social Work Department [facility] [phone]

## 2016-11-11 NOTE — Care Management (Signed)
Hospitalist text paged that [facility] may [facility] today.

## 2016-11-11 NOTE — Discharge Summary (Addendum)
[facility] Discharge Summary   Patient: John Chambers ZOX:[id number]   PCP: Isabella BowensVasireddy, Venugopal Kiran, MD DOB: 08/29/1926   Date of admission: 11/09/2016   Date of discharge:  11/11/2016    Discharge Diagnoses:  Principal Problem:   CAP (community acquired pneumonia) Active Problems:   HTN (hypertension)   Hyperlipidemia   COPD (chronic obstructive pulmonary disease) (HCC)   GERD (gastroesophageal reflux disease)   Hyponatremia   Anemia   BPH (benign prostatic hyperplasia)   Sepsis (HCC)   Atrial fibrillation with RVR (HCC)   Palliative care encounter   Goals of care, counseling/discussion   Encounter for hospice care discussion   Pressure injury of skin   Admitted From: home Disposition:  Inpatient hospice  Recommendations for Outpatient Follow-up:  1. Please follow up with inpatient hospice facility.    Diet recommendation: comfort feeds  Activity: The patient is advised to gradually reintroduce usual activities.  Discharge Condition: stable  Code Status: DNR DNI  History of present illness: As per the H and P dictated on admission, "John EvensWilliam T Riviere is a 83 y.o. male with medical history significant of anemia, BPH, chronic insomnia, COPD, GERD, hyperlipidemia, hypertension who was brought from his home due to difficulty breathing earlier today. Apparently the patient was doing well yesterday evening, per his son in law. He slept late this morning and woke up around 12:30. He woke up with shortness of breath and malaise. He was given his hospice medications (30 mg sublingual morphine and 0.5 mg of Ativan about 1430, but subsequently EMS had to be called. He was found to be tachypneic, tachycardic with atrial fibrillation and RVR and hypoxemic at 52%. EMS provided supplemental oxygen, Solu-Medrol 125 mg IVP, nebulized albuterol and 200 mL of normal saline before bringing him to the emergency department. He is currently saturating in the mid 90s on nonrebreather mask and  is unable to speak full sentences. His son-in-law states that he has not had his experienced regular caregivers since yesterday and family members are currently taking care of him."  Hospital Course:  Summary of his active problems in the hospital is as following. CAP (community acquired pneumonia) Sepsis due to pneumonia [facility](HCC) Goals of care discussion Initially started on septic protocol. Given IV vancomycin and Levaquin. Vancomycin was later on d discontinued. BiPAP when necessary was also ordered but did not require. Palliative care was consulted due to poor prognosis. Currently the patient is lethargic, not following command. But remains comfortable. Continue comfort measures at present. Awaiting arrangement for inpatient hospice.  Active Problems: COPD (chronic obstructive pulmonary disease) (HCC) Continue supplemental oxygen. Continue duo nebs every 6 hours. Albuterol nebs every 4 hours as needed.  HTN (hypertension) Hold antihypertensives and diuretics for now. Monitor blood pressure.  Hyperlipidemia Holdatorvastatin for now.  chronic Atrial fibrillation with RVR (HCC) CHA2DS2-VASc Score of at least 3. Not on anticoagulation for medications for rate control.  GERD (gastroesophageal reflux disease) Hyponatremia nutritional Anemia, worsen due to dilutional effect BPH (benign prostatic hyperplasia)   pressure ulver, stage III sacrum, unstageable left heel, stage  Left hip. POA  All other chronic medical condition were stable during the hospitalization.  On the day of the discharge the patient's symptoms were controlled, and no other acute medical condition were reported by patient. the patient was felt safe to be discharge at inpatient hospice.  Procedures and Results:  none   Consultations:  Palliative care  DISCHARGE MEDICATION: Current Discharge Medication List    CONTINUE these medications which have  NOT CHANGED   Details    acetaminophen (TYLENOL) 500 MG tablet Take 500-1,000 mg by mouth every 6 (six) hours as needed. Pain    ipratropium-albuterol (DUONEB) 0.5-2.5 (3) MG/3ML SOLN USE 1 VIAL IN NEBULIZER TWICE DAILY AS DIRECTED Refills: 3    morphine (ROXANOL) 20 MG/ML concentrated solution Take 10 mg by mouth every 2 (two) hours as needed for severe pain or shortness of breath.    oxymetazoline (NASAL SPRAY 12 HOUR) 0.05 % nasal spray Place 1 spray into both nostrils 2 (two) times daily as needed for congestion.    senna (SENOKOT) 8.6 MG TABS tablet Take 1-2 tablets by mouth daily as needed for mild constipation.      STOP taking these medications     amitriptyline (ELAVIL) 10 MG tablet      amitriptyline (ELAVIL) 25 MG tablet      [employer] (DERMACLOUD) CREA      methadone (DOLOPHINE) 5 MG tablet      oxyCODONE-acetaminophen (PERCOCET/ROXICET) 5-325 MG per tablet      SPIRIVA RESPIMAT 1.25 MCG/ACT AERS      amLODipine (NORVASC) 10 MG tablet      atorvastatin (LIPITOR) 10 MG tablet      escitalopram (LEXAPRO) 10 MG tablet      mirtazapine (REMERON) 30 MG tablet      spironolactone (ALDACTONE) 25 MG tablet      tamsulosin (FLOMAX) 0.4 MG CAPS capsule        Allergies  Allergen Reactions  . Milk-Related Compounds Other (See Comments)    Gi UPSET "SOMETIMES"  . Salicylates   . Aspirin Itching, Swelling, Rash and Other (See Comments)    GI upset  . Penicillins Rash   Discharge Instructions    Increase activity slowly    Complete by:  As directed      Discharge Exam: Filed Weights   11/09/16 1613 11/09/16 1959 11/10/16 0500  Weight: 62.3 kg (137 lb 5.6 oz) 60.9 kg (134 lb 4.2 oz) 60.9 kg (134 lb 4.2 oz)   Vitals:   11/10/16 2015 11/11/16 1500  BP:  96/68  Pulse: (!) 120 (!) 111  Resp: 18 20  Temp:  98.7 F (37.1 C)   General: Appear in moderate distress, no Rash; Oral Mucosa moist. Cardiovascular: S1 and S2 Present, no Murmur, no JVD Respiratory: Bilateral Air  entry present and basal Crackles, Occasional wheezes Abdomen: Bowel Sound present, Extremities: no Pedal edema, no calf tenderness  The results of significant diagnostics from this hospitalization (including imaging, microbiology, ancillary and laboratory) are listed below for reference.    Significant Diagnostic Studies: Dg Chest Port 1 View  Result Date: 11/09/2016 CLINICAL DATA:  Respiratory distress. EXAM: PORTABLE CHEST 1 VIEW COMPARISON:  Radiograph of May 24, 2016. FINDINGS: Stable cardiomediastinal silhouette. Atherosclerosis of thoracic aorta is noted. No pneumothorax is noted. Right lung is clear. Interval development of left perihilar and basilar opacity is noted concerning for pneumonia or possibly edema. Bony thorax is unremarkable. IMPRESSION: Aortic atherosclerosis. Interval development of left perihilar and basilar opacity concerning for pneumonia or possibly edema. Electronically Signed   By: Lupita Raider, M.D.   On: 11/09/2016 15:51    Microbiology: Recent Results (from the past 240 hour(s))  Blood Culture (routine x 2)     Status: None (Preliminary result)   Collection Time: 11/09/16  4:16 PM  Result Value Ref Range Status   Specimen Description LEFT ANTECUBITAL  Final   Special Requests   Final  BOTTLES DRAWN AEROBIC AND ANAEROBIC Blood Culture results may not be optimal due to an inadequate volume of blood received in culture bottles   Culture NO GROWTH 2 DAYS  Final   Report Status PENDING  Incomplete  Blood Culture (routine x 2)     Status: None (Preliminary result)   Collection Time: 11/09/16  4:16 PM  Result Value Ref Range Status   Specimen Description BLOOD LEFT WRIST  Final   Special Requests   Final    BOTTLES DRAWN AEROBIC AND ANAEROBIC Blood Culture adequate volume   Culture NO GROWTH 2 DAYS  Final   Report Status PENDING  Incomplete  MRSA PCR Screening     Status: None   Collection Time: 11/10/16 12:00 AM  Result Value Ref Range Status   MRSA  by PCR NEGATIVE NEGATIVE Final    Comment:        The GeneXpert MRSA Assay (FDA approved for NASAL specimens only), is one component of a comprehensive MRSA colonization surveillance program. It is not intended to diagnose MRSA infection nor to guide or monitor treatment for MRSA infections.      Labs: CBC:  Recent Labs Lab 11/09/16 1532 11/10/16 0519  WBC 13.6* 5.1  NEUTROABS 12.3* 4.5  HGB 12.1* 9.8*  HCT 36.7* 29.6*  MCV 106.1* 105.7*  PLT 394 317   Basic Metabolic Panel:  Recent Labs Lab 11/09/16 1532 11/09/16 1745 11/10/16 0519  NA 132*  --  135  K 4.5  --  4.5  CL 94*  --  104  CO2 21*  --  25  GLUCOSE 125*  --  133*  BUN 26*  --  32*  CREATININE 0.85  --  0.51*  CALCIUM 8.2*  --  7.6*  MG  --  1.4* 1.7  PHOS  --  5.6*  --    Liver Function Tests:  Recent Labs Lab 11/10/16 0519  AST 26  ALT 14*  ALKPHOS 60  BILITOT 0.4  PROT 4.7*  ALBUMIN 1.7*   No results for input(s): LIPASE, AMYLASE in the last 168 hours. No results for input(s): AMMONIA in the last 168 hours. Cardiac Enzymes:  Recent Labs Lab 11/09/16 1532  TROPONINI <0.03   BNP (last 3 results)  Recent Labs  11/09/16 1532  BNP 513.0*   CBG: No results for input(s): GLUCAP in the last 168 hours. Time spent: 45 minutes  Signed:  Lynden OxfordEL, PRANAV  [facility]  11/11/2016  , 4:30 PM

## 2016-11-14 LAB — CULTURE, BLOOD (ROUTINE X 2)
CULTURE: NO GROWTH
Culture: NO GROWTH
SPECIAL REQUESTS: ADEQUATE

## 2016-12-17 DEATH — deceased

## 2018-07-07 IMAGING — CR DG CHEST 1V PORT
1 series · 1 of 1 positions shown · non-contrast
Comparison: Radiograph May 24, 2016.

CLINICAL DATA: Respiratory distress.

EXAM:
PORTABLE CHEST 1 VIEW

[portable]
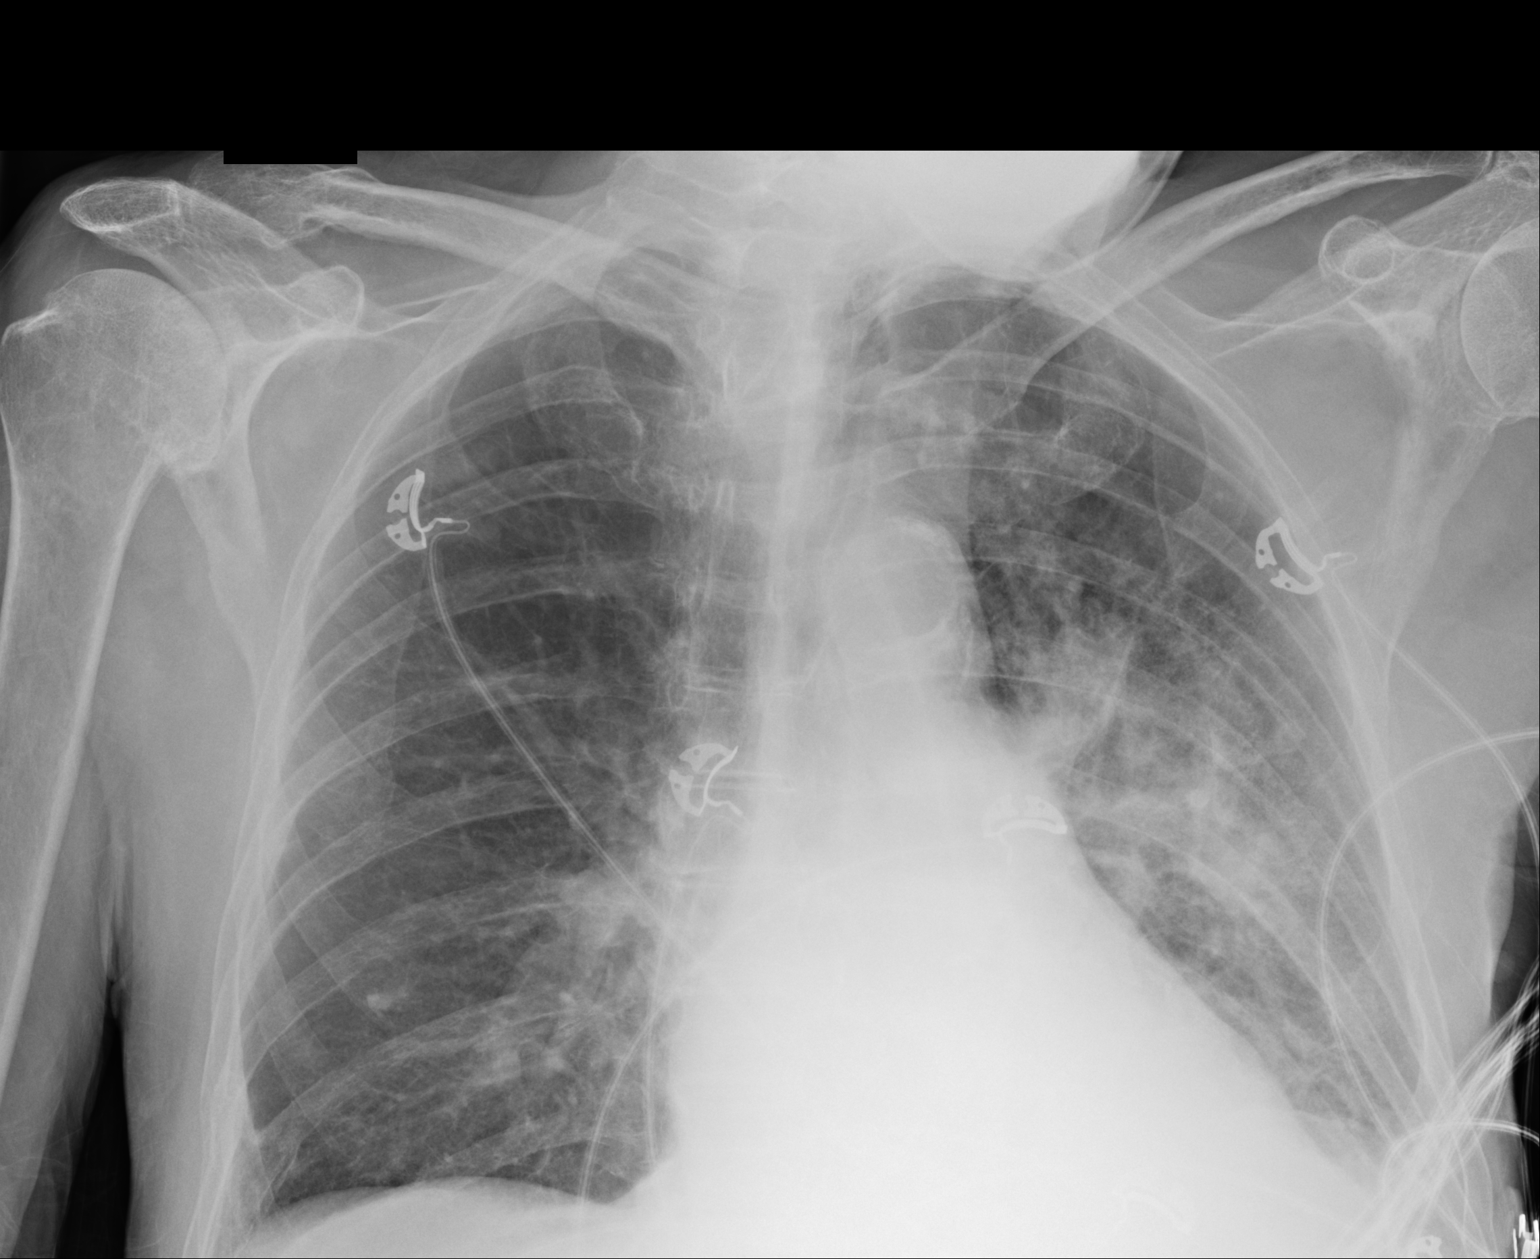

[1 of 1 positions shown; findings below may reference images not displayed]

FINDINGS: Stable cardiomediastinal silhouette. Atherosclerosis of thoracic
aorta is noted. No pneumothorax is noted. Right lung is clear.
Interval development of left perihilar and basilar opacity is noted
concerning for pneumonia or possibly edema. Bony thorax is
unremarkable.
IMPRESSION: Aortic atherosclerosis. Interval development of left perihilar and
basilar opacity concerning for pneumonia or possibly edema.
